# Patient Record
Sex: Male | Born: 1975 | Race: White | Hispanic: No | Marital: Single | Smoking: Current every day smoker
Health system: Southern US, Community
[De-identification: ages and names within clinical notes are randomized; demographics above are authoritative.]

## PROBLEM LIST (undated history)

## (undated) DIAGNOSIS — F909 Attention-deficit hyperactivity disorder, unspecified type: Secondary | ICD-10-CM

## (undated) DIAGNOSIS — J449 Chronic obstructive pulmonary disease, unspecified: Secondary | ICD-10-CM

---

## 2019-11-16 ENCOUNTER — Ambulatory Visit (HOSPITAL_COMMUNITY)
Admission: EM | Admit: 2019-11-16 | Discharge: 2019-11-16 | Disposition: A | Discharge status: Home / Self Care | Payer: Self-pay

## 2019-11-19 ENCOUNTER — Emergency Department (HOSPITAL_COMMUNITY)
Admission: EM | Admit: 2019-11-19 | Discharge: 2019-11-20 | Disposition: A | Discharge status: Home / Self Care | Payer: Self-pay | Attending: Emergency Medicine | Admitting: Emergency Medicine

## 2019-11-19 ENCOUNTER — Encounter (HOSPITAL_COMMUNITY): Payer: Self-pay | Admitting: Emergency Medicine

## 2019-11-19 ENCOUNTER — Other Ambulatory Visit: Payer: Self-pay

## 2019-11-19 DIAGNOSIS — F10930 Alcohol use, unspecified with withdrawal, uncomplicated: Secondary | ICD-10-CM

## 2019-11-19 DIAGNOSIS — R109 Unspecified abdominal pain: Secondary | ICD-10-CM | POA: Insufficient documentation

## 2019-11-19 DIAGNOSIS — F17210 Nicotine dependence, cigarettes, uncomplicated: Secondary | ICD-10-CM | POA: Insufficient documentation

## 2019-11-19 DIAGNOSIS — R112 Nausea with vomiting, unspecified: Secondary | ICD-10-CM | POA: Insufficient documentation

## 2019-11-19 DIAGNOSIS — R000 Tachycardia, unspecified: Secondary | ICD-10-CM | POA: Insufficient documentation

## 2019-11-19 DIAGNOSIS — F10230 Alcohol dependence with withdrawal, uncomplicated: Secondary | ICD-10-CM | POA: Insufficient documentation

## 2019-11-19 LAB — COMPREHENSIVE METABOLIC PANEL
ALT: 21 U/L (ref 0–44)
AST: 27 U/L (ref 15–41)
Albumin: 4.3 g/dL (ref 3.5–5.0)
Alkaline Phosphatase: 77 U/L (ref 38–126)
Anion gap: 15 (ref 5–15)
BUN: 13 mg/dL (ref 6–20)
CO2: 22 mmol/L (ref 22–32)
Calcium: 9.2 mg/dL (ref 8.9–10.3)
Chloride: 100 mmol/L (ref 98–111)
Creatinine, Ser: 0.85 mg/dL (ref 0.61–1.24)
GFR, Estimated: >60 mL/min (ref >60)
Glucose, Bld: 103 mg/dL — ABNORMAL HIGH (ref 70–99)
Potassium: 3.5 mmol/L (ref 3.5–5.1)
Sodium: 137 mmol/L (ref 135–145)
Total Bilirubin: 1.3 mg/dL — ABNORMAL HIGH (ref 0.3–1.2)
Total Protein: 6.8 g/dL (ref 6.5–8.1)

## 2019-11-19 LAB — RAPID URINE DRUG SCREEN, HOSP PERFORMED
Amphetamines: NOT DETECTED
Barbiturates: NOT DETECTED
Benzodiazepines: NOT DETECTED
Cocaine: NOT DETECTED
Opiates: NOT DETECTED
Tetrahydrocannabinol: POSITIVE — AB

## 2019-11-19 LAB — CBC
HCT: 48.8 % (ref 39.0–52.0)
Hemoglobin: 16.4 g/dL (ref 13.0–17.0)
MCH: 32.2 pg (ref 26.0–34.0)
MCHC: 33.6 g/dL (ref 30.0–36.0)
MCV: 95.7 fL (ref 80.0–100.0)
Platelets: 319 10*3/uL (ref 150–400)
RBC: 5.1 MIL/uL (ref 4.22–5.81)
RDW: 12.8 % (ref 11.5–15.5)
WBC: 10 10*3/uL (ref 4.0–10.5)
nRBC: 0 % (ref 0.0–0.2)

## 2019-11-19 LAB — ETHANOL: Alcohol, Ethyl (B): 166 mg/dL — ABNORMAL HIGH (ref <10)

## 2019-11-19 NOTE — ED Triage Notes (Signed)
Pt reports he has been "drinking for a week" and needs to detox. States he "sips" liquor throughout the day, last drink at 6pm today. No SI/HI.

## 2019-11-20 MED ORDER — CHLORDIAZEPOXIDE HCL 25 MG PO CAPS: ORAL_CAPSULE | ORAL | 0 refills | Status: AC

## 2019-11-20 MED ORDER — LORAZEPAM 1 MG PO TABS: 0.0000 mg | ORAL_TABLET | Freq: Two times a day (BID) | ORAL | Status: DC

## 2019-11-20 MED ORDER — LORAZEPAM 2 MG/ML IJ SOLN: 0.0000 mg | Freq: Four times a day (QID) | INTRAMUSCULAR | Status: DC

## 2019-11-20 MED ORDER — THIAMINE HCL 100 MG PO TABS
100.0000 mg | ORAL_TABLET | Freq: Every day | ORAL | Status: DC
  Administered 2019-11-20: 100 mg via ORAL
  Filled 2019-11-20: qty 1

## 2019-11-20 MED ORDER — ONDANSETRON HCL 4 MG/2ML IJ SOLN
4.0000 mg | Freq: Once | INTRAMUSCULAR | Status: AC
  Administered 2019-11-20: 4 mg via INTRAVENOUS
  Filled 2019-11-20: qty 2

## 2019-11-20 MED ORDER — SODIUM CHLORIDE 0.9 % IV BOLUS
1000.0000 mL | Freq: Once | INTRAVENOUS | Status: AC
  Administered 2019-11-20: 1000 mL via INTRAVENOUS

## 2019-11-20 MED ORDER — LORAZEPAM 2 MG/ML IJ SOLN
1.0000 mg | Freq: Once | INTRAMUSCULAR | Status: AC
  Administered 2019-11-20: 1 mg via INTRAVENOUS
  Filled 2019-11-20: qty 1

## 2019-11-20 MED ORDER — THIAMINE HCL 100 MG/ML IJ SOLN: 100.0000 mg | Freq: Every day | INTRAMUSCULAR | Status: DC

## 2019-11-20 MED ORDER — LORAZEPAM 1 MG PO TABS
0.0000 mg | ORAL_TABLET | Freq: Four times a day (QID) | ORAL | Status: DC
  Administered 2019-11-20 (×2): 1 mg via ORAL
  Filled 2019-11-20: qty 2
  Filled 2019-11-20: qty 1

## 2019-11-20 MED ORDER — LORAZEPAM 2 MG/ML IJ SOLN: 0.0000 mg | Freq: Two times a day (BID) | INTRAMUSCULAR | Status: DC

## 2019-11-20 NOTE — Discharge Instructions (Signed)
Please read and follow all provided instructions.  Your diagnoses today include:  1. Alcohol withdrawal syndrome without complication (HCC)     Tests performed today include: Blood cell counts (white, red, and platelets) Electrolytes  Kidney function test Urine test to check for infection Alcohol level - was high  Vital signs. See below for your results today.   Medications prescribed:   Librium - medication to help prevent serious withdrawal symptoms  Take any prescribed medications only as directed.  Home care instructions:  Follow any educational materials contained in this packet.  BE VERY CAREFUL not to take multiple medicines containing Tylenol (also called acetaminophen). Doing so can lead to an overdose which can damage your liver and cause liver failure and possibly death.   Follow-up instructions: Please follow-up with your primary care provider in the next 3 days for further evaluation of your symptoms.   Return instructions:   Please return to the Emergency Department if you experience worsening symptoms.   Please return if you have any other emergent concerns.  Additional Information:  Your vital signs today were: BP 132/85    Pulse 77    Temp 98.6 F (37 C) (Oral)    Resp 15    SpO2 97%  If your blood pressure (BP) was elevated above 135/85 this visit, please have this repeated by your doctor within one month. --------------

## 2019-11-20 NOTE — ED Notes (Addendum)
Pt given ativan before discharge as requested by MD. Pt is given a cab voucher, resting comfortably and understood discharge instructions. Vital signs stable.

## 2019-11-20 NOTE — ED Notes (Signed)
Additional Ativan given as requested by MD.

## 2019-11-20 NOTE — ED Provider Notes (Signed)
[facility] EMERGENCY DEPARTMENT Provider Note   CSN: [id number] Arrival date & time: 11/19/19  1845     History Chief Complaint  Patient presents with   Alcohol Problem    Kirk Hinton is a 44 y.o. male.  Patient presents to the emergency department for evaluation of alcohol withdrawal symptoms.  Patient has a history of alcohol abuse.  He states that he started drinking vodka heavily about a week ago.  Last alcohol intake was 6 PM yesterday.  Prior to that he was "sipping on liquor".  Patient reports worsening tremors, nausea and vomiting, diarrhea.  Patient has had a 13-hour stay in the waiting room prior to being evaluated during which time his symptoms have worsened.  No headache or confusion.  He has generalized abdominal cramping but no focal pain.  No urinary symptoms.  He states that he vomited 4 times in the waiting room.  He has been in rehab in the past, most recently in June of this year.  He denies previous seizures or hallucinations with withdrawal from alcohol.        History reviewed. No pertinent past medical history.  There are no problems to display for this patient.   History reviewed. No pertinent surgical history.     No family history on file.  Social History   Tobacco Use   Smoking status: Current Every Day Smoker  Substance Use Topics   Alcohol use: Yes   Drug use: Not Currently    Home Medications Prior to Admission medications   Not on File    Allergies    Patient has no known allergies.  Review of Systems   Review of Systems  Constitutional: Positive for chills. Negative for fever.  HENT: Negative for rhinorrhea and sore throat.   Eyes: Negative for redness.  Respiratory: Negative for cough and shortness of breath.   Cardiovascular: Negative for chest pain.  Gastrointestinal: Positive for abdominal pain, diarrhea, nausea and vomiting.  Genitourinary: Negative for dysuria and hematuria.    Musculoskeletal: Positive for myalgias.  Skin: Negative for rash.  Neurological: Negative for headaches.    Physical Exam Updated Vital Signs BP (!) 167/94    Pulse (!) 116    Temp 99 F (37.2 C) (Oral)    Resp 18    SpO2 97%   Physical Exam Vitals and nursing note reviewed.  Constitutional:      Appearance: He is well-developed.  HENT:     Head: Normocephalic and atraumatic.     Nose: Nose normal.     Mouth/Throat:     Mouth: Mucous membranes are moist.  Eyes:     General:        Right eye: No discharge.        Left eye: No discharge.     Conjunctiva/sclera: Conjunctivae normal.  Cardiovascular:     Rate and Rhythm: Regular rhythm. Tachycardia present.     Heart sounds: Normal heart sounds.  Pulmonary:     Effort: Pulmonary effort is normal.     Breath sounds: Normal breath sounds.  Abdominal:     Palpations: Abdomen is soft.     Tenderness: There is no abdominal tenderness. There is no guarding or rebound.  Musculoskeletal:     Cervical back: Normal range of motion and neck supple.  Skin:    General: Skin is warm and dry.  Neurological:     Mental Status: He is alert.     Comments: Mild tremors.  I watched  the patient ambulate to the bathroom without any difficulty.     ED Results / Procedures / Treatments   Labs (all labs ordered are listed, but only abnormal results are displayed) Labs Reviewed  COMPREHENSIVE METABOLIC PANEL - Abnormal; Notable for the following components:      Result Value   Glucose, Bld 103 (*)    Total Bilirubin 1.3 (*)    All other components within normal limits  ETHANOL - Abnormal; Notable for the following components:   Alcohol, Ethyl (B) 166 (*)    All other components within normal limits  RAPID URINE DRUG SCREEN, HOSP PERFORMED - Abnormal; Notable for the following components:   Tetrahydrocannabinol POSITIVE (*)    All other components within normal limits  CBC    EKG None  Radiology No results  found.  Procedures Procedures (including critical care time)  Medications Ordered in ED Medications  LORazepam (ATIVAN) injection 0-4 mg (has no administration in time range)    Or  LORazepam (ATIVAN) tablet 0-4 mg (has no administration in time range)  LORazepam (ATIVAN) injection 0-4 mg (has no administration in time range)    Or  LORazepam (ATIVAN) tablet 0-4 mg (has no administration in time range)  thiamine tablet 100 mg (has no administration in time range)    Or  thiamine (B-1) injection 100 mg (has no administration in time range)  ondansetron (ZOFRAN) injection 4 mg (has no administration in time range)  sodium chloride 0.9 % bolus 1,000 mL (has no administration in time range)    ED Course  I have reviewed the triage vital signs and the nursing notes.  Pertinent labs & imaging results that were available during my care of the patient were reviewed by me and considered in my medical decision making (see chart for details).  Patient seen and examined. Work-up initiated. Medications ordered.  He has significant withdrawals.  I would like to give Ativan, Zofran, fluid bolus and reassess.  Will ask RN to calculate CIWA score.  Consult to PEER support placed.  Vital signs reviewed and are as follows: BP (!) 167/94    Pulse (!) 116    Temp 99 F (37.2 C) (Oral)    Resp 18    SpO2 97%   9:00 AM CIWA=10. 1mg  ativan ordered. Will reassess.   10:13 AM patient reassessed.  He states that he is feeling a bit better.  He is still somewhat tremulous.  I will give another milligram of Ativan and reassess.  Heart rate is improved.  BP (!) 150/84 (BP Location: Right Arm)    Pulse 95    Temp 98.6 F (37 C) (Oral)    Resp 16    SpO2 97%   1:15 PM patient rechecked.  CIWA down to 4.  He is feeling much better.  Heart rate remains controlled.  Tremors are nearly resolved.  Plan for discharge to home on Librium taper plus outpatient substance abuse counseling referrals.  Patient is in  agreement with plan.  He is anxious to go home.  Encouraged return to the emergency department with uncontrolled symptoms, hallucinations, seizures, or other concerns.    MDM Rules/Calculators/A&P                          Patient presents to the emergency department today after alcohol cessation.  He was treated with Ativan with much improvement in his symptoms.  Feel patient is stable for discharged home at this point.  He has not had any seizures.  No history of DTs or any evidence of the same today.  Plan as above.  Labs unremarkable.   Final Clinical Impression(s) / ED Diagnoses Final diagnoses:  Alcohol withdrawal syndrome without complication [facility])    Rx / DC Orders ED Discharge Orders         Ordered    chlordiazePOXIDE (LIBRIUM) 25 MG capsule        11/20/19 1312           Renne Crigler, PA-C 11/20/19 1317    Little, Ambrose Finland, MD 11/20/19 1547

## 2019-11-20 NOTE — ED Notes (Signed)
Pt escorted by this RN to the cab. Pt ambulated with steady gait.

## 2020-09-28 ENCOUNTER — Emergency Department (HOSPITAL_COMMUNITY)
Admission: EM | Admit: 2020-09-28 | Discharge: 2020-09-28 | Disposition: A | Discharge status: Home / Self Care | Payer: Self-pay | Attending: Emergency Medicine | Admitting: Emergency Medicine

## 2020-09-28 ENCOUNTER — Emergency Department (HOSPITAL_COMMUNITY): Payer: Self-pay

## 2020-09-28 ENCOUNTER — Encounter (HOSPITAL_COMMUNITY): Payer: Self-pay | Admitting: Emergency Medicine

## 2020-09-28 DIAGNOSIS — U071 COVID-19: Secondary | ICD-10-CM | POA: Insufficient documentation

## 2020-09-28 DIAGNOSIS — J449 Chronic obstructive pulmonary disease, unspecified: Secondary | ICD-10-CM | POA: Insufficient documentation

## 2020-09-28 DIAGNOSIS — F17200 Nicotine dependence, unspecified, uncomplicated: Secondary | ICD-10-CM | POA: Insufficient documentation

## 2020-09-28 LAB — CBC WITH DIFFERENTIAL/PLATELET
Abs Immature Granulocytes: 0.03 10*3/uL (ref 0.00–0.07)
Basophils Absolute: 0.1 10*3/uL (ref 0.0–0.1)
Basophils Relative: 1 %
Eosinophils Absolute: 0 10*3/uL (ref 0.0–0.5)
Eosinophils Relative: 0 %
HCT: 53.3 % — ABNORMAL HIGH (ref 39.0–52.0)
Hemoglobin: 18.2 g/dL — ABNORMAL HIGH (ref 13.0–17.0)
Immature Granulocytes: 0 %
Lymphocytes Relative: 15 %
Lymphs Abs: 1.8 10*3/uL (ref 0.7–4.0)
MCH: 33 pg (ref 26.0–34.0)
MCHC: 34.1 g/dL (ref 30.0–36.0)
MCV: 96.7 fL (ref 80.0–100.0)
Monocytes Absolute: 1.7 10*3/uL — ABNORMAL HIGH (ref 0.1–1.0)
Monocytes Relative: 14 %
Neutro Abs: 8.8 10*3/uL — ABNORMAL HIGH (ref 1.7–7.7)
Neutrophils Relative %: 70 %
Platelets: 267 10*3/uL (ref 150–400)
RBC: 5.51 MIL/uL (ref 4.22–5.81)
RDW: 13.5 % (ref 11.5–15.5)
WBC: 12.4 10*3/uL — ABNORMAL HIGH (ref 4.0–10.5)
nRBC: 0 % (ref 0.0–0.2)

## 2020-09-28 LAB — BASIC METABOLIC PANEL
Anion gap: 11 (ref 5–15)
BUN: 14 mg/dL (ref 6–20)
CO2: 22 mmol/L (ref 22–32)
Calcium: 9.1 mg/dL (ref 8.9–10.3)
Chloride: 104 mmol/L (ref 98–111)
Creatinine, Ser: 0.78 mg/dL (ref 0.61–1.24)
GFR, Estimated: >60 mL/min (ref >60)
Glucose, Bld: 86 mg/dL (ref 70–99)
Potassium: 3.7 mmol/L (ref 3.5–5.1)
Sodium: 137 mmol/L (ref 135–145)

## 2020-09-28 NOTE — ED Provider Notes (Signed)
[facility] EMERGENCY DEPARTMENT Provider Note   CSN: [id number] Arrival date & time: 09/28/20  1459     History No chief complaint on file.   Kirk Hinton is a 45 y.o. male with PMHx COPD who presents for evaluation of cough.   Patient reports an approximately 1 day history of fever, chills, myalgias and nonproductive cough.  He states that his girlfriend recently tested positive for COVID-19, and he tested positive on antigen test yesterday.  He states that he has not experienced any chest pain or shortness of breath.  He states that his girlfriend was insistent that he come to be evaluated for his symptoms and determine whether he would be appropriate for Paxil of it.  He subsequently presented to our emergency department for further evaluation.     History reviewed. No pertinent past medical history.  There are no problems to display for this patient.   History reviewed. No pertinent surgical history.     History reviewed. No pertinent family history.  Social History   Tobacco Use   Smoking status: Every Day   Smokeless tobacco: Never  Substance Use Topics   Alcohol use: Yes   Drug use: Not Currently    Home Medications Prior to Admission medications   Medication Sig Start Date End Date Taking? Authorizing Provider  chlordiazePOXIDE (LIBRIUM) 25 MG capsule 50mg  PO TID x 1D, then 25-50mg  PO BID X 1D, then 25-50mg  PO QD X 1D 11/20/19   13/9/21, PA-C  escitalopram (LEXAPRO) 20 MG tablet Take 20 mg by mouth daily.    [provider]  multivitamin (ONE-A-DAY MEN'S) TABS tablet Take 1 tablet by mouth daily with breakfast.     [provider]    Allergies    Lidocaine  Review of Systems   Review of Systems  Constitutional:  Positive for chills, fatigue and fever.  HENT:  Negative for ear pain and sore throat.   Eyes:  Negative for pain and visual disturbance.  Respiratory:  Positive for cough. Negative for shortness  of breath.   Cardiovascular:  Negative for chest pain and palpitations.  Gastrointestinal:  Negative for abdominal pain and vomiting.  Genitourinary:  Negative for dysuria and hematuria.  Musculoskeletal:  Positive for myalgias. Negative for arthralgias and back pain.  Skin:  Negative for color change and rash.  Neurological:  Negative for seizures and syncope.  All other systems reviewed and are negative.  Physical Exam Updated Vital Signs BP (!) 133/93 (BP Location: Left Arm)   Pulse 73   Temp 98.1 F (36.7 C) (Oral)   Resp 18   SpO2 98%   Physical Exam Vitals and nursing note reviewed.  Constitutional:      Appearance: He is well-developed.  HENT:     Head: Normocephalic and atraumatic.     Mouth/Throat:     Mouth: Mucous membranes are moist.  Eyes:     Conjunctiva/sclera: Conjunctivae normal.  Cardiovascular:     Rate and Rhythm: Normal rate and regular rhythm.     Heart sounds: No murmur heard. Pulmonary:     Effort: Pulmonary effort is normal. No respiratory distress.     Breath sounds: Normal breath sounds.  Abdominal:     Palpations: Abdomen is soft.     Tenderness: There is no abdominal tenderness.  Musculoskeletal:        General: Normal range of motion.     Cervical back: Neck supple.  Skin:    General: Skin is warm  and dry.     Capillary Refill: Capillary refill takes less than 2 seconds.  Neurological:     General: No focal deficit present.     Mental Status: He is alert and oriented to person, place, and time. Mental status is at baseline.    ED Results / Procedures / Treatments   Labs (all labs ordered are listed, but only abnormal results are displayed) Labs Reviewed  CBC WITH DIFFERENTIAL/PLATELET - Abnormal; Notable for the following components:      Result Value   WBC 12.4 (*)    Hemoglobin 18.2 (*)    HCT 53.3 (*)    Neutro Abs 8.8 (*)    Monocytes Absolute 1.7 (*)    All other components within normal limits  BASIC METABOLIC PANEL     EKG None  Radiology DG Chest 1 View  Result Date: 09/28/2020 CLINICAL DATA:  Cough.  COVID-19 exposure. EXAM: CHEST  1 VIEW COMPARISON:  None. FINDINGS: The heart, hila, and mediastinum are normal. No pneumothorax. Hyperinflation of the lungs. No nodule, mass, or focal infiltrate. IMPRESSION: Hyperinflation of the lungs may be due to an exuberant inspiratory effort in this relatively young patient. Air trapping from asthma, COPD, or emphysema could have a similar appearance. Recommend clinical correlation. Electronically Signed   By: Gerome Sam III M.D.   On: 09/28/2020 16:34    Procedures Procedures   Medications Ordered in ED Medications - No data to display  ED Course  I have reviewed the triage vital signs and the nursing notes.  Pertinent labs & imaging results that were available during my care of the patient were reviewed by me and considered in my medical decision making (see chart for details).    MDM Rules/Calculators/A&P                           45 y.o. male with past medical history as above who presents for evaluation of fever and cough in the context of recent COVID-19 exposures and positive antigen test. Afebrile and hemodynamically stable.  Nonhypoxic on room air.  Exam as detailed above.  While patient does report a history of COPD, he is not on any medications to manage this.  He states that he was told that his diagnosis was very mild.  CBC notable for leukocytosis with left shift.  BMP is normal.  Chest x-ray with hyperinflation but no other acute cardiopulmonary normality such as pneumonia or pneumothorax.  After discussion of ambulatory treatment of COVID-19 such as Paxil of it, the patient states that he would not like to be prescribed these medications and incur additional cost.  I feel that this is appropriate.  Advised patient to continue with symptomatic treatment at home including frequent hydration, Tylenol, and ibuprofen.  Patient stable for discharge  home.  Final Clinical Impression(s) / ED Diagnoses Final diagnoses:  COVID-19 virus infection    Rx / DC Orders ED Discharge Orders     None        Holley Dexter, MD 09/28/20 1831    Terrilee Files, MD 09/29/20 1027

## 2020-09-28 NOTE — ED Provider Notes (Signed)
Emergency Medicine Provider Triage Evaluation Note  Amel Gianino , a 45 y.o. male  was evaluated in triage.  Pt complains of cough and fever.  Tested positive for COVID yesterday.  Girlfriend insisted he come in to get Paxlovid.  Review of Systems  Positive: Shortness of breath, sore throat Negative: Chest pain  Physical Exam  BP (!) 133/93 (BP Location: Left Arm)   Pulse 73   Temp 98.1 F (36.7 C) (Oral)   Resp 18   SpO2 98%  Gen:   Awake, no distress   Resp:  Normal effort, clear to auscultation bilaterally MSK:   Moves extremities without difficulty  Other:    Medical Decision Making  Medically screening exam initiated at 3:32 PM.  Appropriate orders placed.  Dmani Mizer was informed that the remainder of the evaluation will be completed by another provider, this initial triage assessment does not replace that evaluation, and the importance of remaining in the ED until their evaluation is complete.     Teressa Lower, PA-C 09/28/20 1533    Franne Forts, DO 09/30/20 [phone]

## 2020-09-28 NOTE — ED Triage Notes (Signed)
Tested COVID + yesterday.  C/o cough and fever.  Requesting Paxlovid.

## 2020-10-17 ENCOUNTER — Other Ambulatory Visit: Payer: Self-pay

## 2020-10-17 ENCOUNTER — Encounter (HOSPITAL_COMMUNITY): Payer: Self-pay

## 2020-10-17 ENCOUNTER — Emergency Department (HOSPITAL_COMMUNITY)
Admission: EM | Admit: 2020-10-17 | Discharge: 2020-10-17 | Disposition: A | Discharge status: Home / Self Care | Payer: Self-pay | Attending: Student | Admitting: Student

## 2020-10-17 DIAGNOSIS — F17210 Nicotine dependence, cigarettes, uncomplicated: Secondary | ICD-10-CM | POA: Insufficient documentation

## 2020-10-17 DIAGNOSIS — S61210A Laceration without foreign body of right index finger without damage to nail, initial encounter: Secondary | ICD-10-CM

## 2020-10-17 DIAGNOSIS — S61211A Laceration without foreign body of left index finger without damage to nail, initial encounter: Secondary | ICD-10-CM | POA: Insufficient documentation

## 2020-10-17 DIAGNOSIS — W268XXA Contact with other sharp object(s), not elsewhere classified, initial encounter: Secondary | ICD-10-CM | POA: Insufficient documentation

## 2020-10-17 DIAGNOSIS — Z23 Encounter for immunization: Secondary | ICD-10-CM | POA: Insufficient documentation

## 2020-10-17 MED ORDER — TETANUS-DIPHTH-ACELL PERTUSSIS 5-2.5-18.5 LF-MCG/0.5 IM SUSY
0.5000 mL | PREFILLED_SYRINGE | Freq: Once | INTRAMUSCULAR | Status: AC
  Administered 2020-10-17: 0.5 mL via INTRAMUSCULAR
  Filled 2020-10-17: qty 0.5

## 2020-10-17 MED ORDER — LIDOCAINE HCL (PF) 1 % IJ SOLN
5.0000 mL | Freq: Once | INTRAMUSCULAR | Status: AC
  Administered 2020-10-17: 5 mL via INTRADERMAL
  Filled 2020-10-17: qty 30

## 2020-10-17 MED ORDER — IBUPROFEN 200 MG PO TABS
600.0000 mg | ORAL_TABLET | Freq: Once | ORAL | Status: AC
  Administered 2020-10-17: 600 mg via ORAL
  Filled 2020-10-17: qty 3

## 2020-10-17 MED ORDER — CHLOROPROCAINE HCL (PF) 2 % IJ SOLN
20.0000 mL | Freq: Once | INTRAMUSCULAR | Status: DC
  Filled 2020-10-17 (×2): qty 20

## 2020-10-17 NOTE — ED Provider Notes (Signed)
Emergency Medicine Provider Triage Evaluation Note  Kirk Hinton , a 45 y.o. male  was evaluated in triage.  Pt complains of laceration.  Dates that he was at work about an hour ago when he was using a box cutter that was about an inch to an inch and half long.  States that he was cutting through a box when he accidentally cut his left pointer finger.  Unknown last tetanus.  Denies other injuries.  Neurovascularly intact.  Injury does not involve the nail.  Review of Systems  Positive: Laceration Negative: Deformity  Physical Exam  BP 136/78 (BP Location: Left Arm)   Pulse (!) 122   Temp 98 F (36.7 C) (Oral)   Resp 18   SpO2 100%  Gen:   Awake, no distress   Resp:  Normal effort  MSK:   Moves extremities without difficulty.  3 cm laceration to the left first digit.  Hemostasis being achieved with pressure.  Pulses equal bilaterally.  Neurovascularly intact. Other:   Medical Decision Making  Medically screening exam initiated at 3:04 PM.  Appropriate orders placed.  Kirk Hinton was informed that the remainder of the evaluation will be completed by another provider, this initial triage assessment does not replace that evaluation, and the importance of remaining in the ED until their evaluation is complete.     Cristopher Peru, PA-C 10/17/20 1506    Mancel Bale, MD 10/19/20 [phone]

## 2020-10-17 NOTE — ED Provider Notes (Signed)
[facility]-EMERGENCY DEPT Provider Note   CSN: [id number] Arrival date & time: 10/17/20  1437     History Chief Complaint  Patient presents with   Laceration    Kirk Hinton is a 45 y.o. male who presents emergency department for evaluation of finger laceration.  Patient states that he was at work this morning and cut his finger with a box cutter.  He denies numbness, tingling, weakness of the extremity.  Pulses are 2+.  Unsure of last tetanus dose.   Laceration Associated symptoms: no fever and no rash       History reviewed. No pertinent past medical history.  There are no problems to display for this patient.   History reviewed. No pertinent surgical history.     History reviewed. No pertinent family history.  Social History   Tobacco Use   Smoking status: Every Day   Smokeless tobacco: Never  Substance Use Topics   Alcohol use: Yes   Drug use: Not Currently    Home Medications Prior to Admission medications   Medication Sig Start Date End Date Taking? Authorizing Provider  chlordiazePOXIDE (LIBRIUM) 25 MG capsule 50mg  PO TID x 1D, then 25-50mg  PO BID X 1D, then 25-50mg  PO QD X 1D 11/20/19   13/9/21, PA-C  escitalopram (LEXAPRO) 20 MG tablet Take 20 mg by mouth daily.    [provider]  multivitamin (ONE-A-DAY MEN'S) TABS tablet Take 1 tablet by mouth daily with breakfast.     [provider]    Allergies    Lidocaine  Review of Systems   Review of Systems  Constitutional:  Negative for chills and fever.  HENT:  Negative for ear pain and sore throat.   Eyes:  Negative for pain and visual disturbance.  Respiratory:  Negative for cough and shortness of breath.   Cardiovascular:  Negative for chest pain and palpitations.  Gastrointestinal:  Negative for abdominal pain and vomiting.  Genitourinary:  Negative for dysuria and hematuria.  Musculoskeletal:  Negative for arthralgias and back pain.  Skin:   Positive for wound. Negative for color change and rash.  Neurological:  Negative for seizures and syncope.  All other systems reviewed and are negative.  Physical Exam Updated Vital Signs BP 136/78 (BP Location: Left Arm)   Pulse (!) 122   Temp 98 F (36.7 C) (Oral)   Resp 18   SpO2 100%   Physical Exam Vitals and nursing note reviewed.  Constitutional:      Appearance: He is well-developed.  HENT:     Head: Normocephalic and atraumatic.  Eyes:     Conjunctiva/sclera: Conjunctivae normal.  Cardiovascular:     Rate and Rhythm: Normal rate and regular rhythm.     Heart sounds: No murmur heard. Pulmonary:     Effort: Pulmonary effort is normal. No respiratory distress.     Breath sounds: Normal breath sounds.  Abdominal:     Palpations: Abdomen is soft.     Tenderness: There is no abdominal tenderness.  Musculoskeletal:     Cervical back: Neck supple.  Skin:    General: Skin is warm and dry.     Findings: Lesion (2.5 cm laceration on the distal finger pad of the left index finger) present.  Neurological:     Mental Status: He is alert.    ED Results / Procedures / Treatments   Labs (all labs ordered are listed, but only abnormal results are displayed) Labs Reviewed - No data to display  EKG None  Radiology No results found.  Procedures .Marland KitchenLaceration Repair  Date/Time: 10/17/2020 10:13 PM Performed by: Glendora Score, MD Authorized by: Glendora Score, MD   Anesthesia:    Anesthesia method:  Nerve block   Block location:  Digital block   Block needle gauge:  24 G   Block anesthetic:  Lidocaine 1% w/o epi   Block injection procedure:  Introduced needle   Block outcome:  Anesthesia achieved Laceration details:    Location:  Finger   Finger location:  L index finger   Length (cm):  2.5 Pre-procedure details:    Preparation:  Patient was prepped and draped in usual sterile fashion Exploration:    Imaging outcome: foreign body not noted     Contaminated:  no   Treatment:    Area cleansed with:  Saline   Amount of cleaning:  Standard   Irrigation solution:  Sterile saline   Irrigation method:  Pressure wash Skin repair:    Repair method:  Sutures   Suture size:  4-0   Suture material:  Prolene   Suture technique:  Simple interrupted   Number of sutures:  7 Approximation:    Approximation:  Close Repair type:    Repair type:  Simple Post-procedure details:    Dressing:  Sterile dressing   Procedure completion:  Tolerated well, no immediate complications   Medications Ordered in ED Medications  Tdap (BOOSTRIX) injection 0.5 mL (has no administration in time range)    ED Course  I have reviewed the triage vital signs and the nursing notes.  Pertinent labs & imaging results that were available during my care of the patient were reviewed by me and considered in my medical decision making (see chart for details).    MDM Rules/Calculators/A&P                           Patient seen emergency department for evaluation of finger laceration.  Physical exam reveals a 2.5 cm laceration to the index finger on the left.  Digital block performed with lidocaine.  Patient previously had a lidocaine allergy listed in the chart but this was actually in relation to a sunscreen and this allergy has since been removed.  The patient had no allergic reaction when lidocaine applied here in the emergency department.  laceration repaired 7 Prolene sutures in simple interrupted fashion.  Tetanus updated and patient discharged. Final Clinical Impression(s) / ED Diagnoses Final diagnoses:  None    Rx / DC Orders ED Discharge Orders     None        Lauren Aguayo, Wyn Forster, MD 10/17/20 2218

## 2020-10-17 NOTE — ED Triage Notes (Signed)
Pt arrived via POV, c/o lac to left ring finger after cutting with box cutter. Also states COVID positive last week, ears blocked.

## 2020-10-27 ENCOUNTER — Other Ambulatory Visit: Payer: Self-pay

## 2020-10-27 ENCOUNTER — Encounter (HOSPITAL_COMMUNITY): Payer: Self-pay

## 2020-10-27 ENCOUNTER — Emergency Department (HOSPITAL_COMMUNITY)
Admission: EM | Admit: 2020-10-27 | Discharge: 2020-10-27 | Disposition: A | Discharge status: Home / Self Care | Payer: Self-pay | Attending: Emergency Medicine | Admitting: Emergency Medicine

## 2020-10-27 DIAGNOSIS — J449 Chronic obstructive pulmonary disease, unspecified: Secondary | ICD-10-CM | POA: Insufficient documentation

## 2020-10-27 DIAGNOSIS — Z4803 Encounter for change or removal of drains: Secondary | ICD-10-CM | POA: Insufficient documentation

## 2020-10-27 DIAGNOSIS — F17210 Nicotine dependence, cigarettes, uncomplicated: Secondary | ICD-10-CM | POA: Insufficient documentation

## 2020-10-27 DIAGNOSIS — Z79899 Other long term (current) drug therapy: Secondary | ICD-10-CM | POA: Insufficient documentation

## 2020-10-27 DIAGNOSIS — Z4802 Encounter for removal of sutures: Secondary | ICD-10-CM

## 2020-10-27 HISTORY — DX: Chronic obstructive pulmonary disease, unspecified: J44.9

## 2020-10-27 HISTORY — DX: Attention-deficit hyperactivity disorder, unspecified type: F90.9

## 2020-10-27 MED ORDER — BACITRACIN ZINC 500 UNIT/GM EX OINT
TOPICAL_OINTMENT | CUTANEOUS | Status: AC
  Administered 2020-10-27: 1 via TOPICAL
  Filled 2020-10-27: qty 0.9

## 2020-10-27 MED ORDER — BACITRACIN ZINC 500 UNIT/GM EX OINT: TOPICAL_OINTMENT | Freq: Once | CUTANEOUS | Status: AC

## 2020-10-27 NOTE — ED Provider Notes (Signed)
[facility]-EMERGENCY DEPT Provider Note   CSN: [id number] Arrival date & time: 10/27/20  1039     History Chief Complaint  Patient presents with   Suture / Staple Removal    Kirk Hinton is a 45 y.o. male.  The history is provided by the patient.  Suture / Staple Removal This is a new problem. The current episode started more than 2 days ago. The problem occurs constantly. Nothing aggravates the symptoms. Nothing relieves the symptoms. He has tried nothing for the symptoms. The treatment provided no relief.      Past Medical History:  Diagnosis Date   ADHD    COPD (chronic obstructive pulmonary disease) (HCC)     There are no problems to display for this patient.   History reviewed. No pertinent surgical history.     History reviewed. No pertinent family history.  Social History   Tobacco Use   Smoking status: Every Day    Packs/day: 1.00    Types: Cigarettes   Smokeless tobacco: Never  Vaping Use   Vaping Use: Never used  Substance Use Topics   Alcohol use: Yes   Drug use: Not Currently    Home Medications Prior to Admission medications   Medication Sig Start Date End Date Taking? Authorizing Provider  chlordiazePOXIDE (LIBRIUM) 25 MG capsule 50mg  PO TID x 1D, then 25-50mg  PO BID X 1D, then 25-50mg  PO QD X 1D 11/20/19   13/9/21, PA-C  escitalopram (LEXAPRO) 20 MG tablet Take 20 mg by mouth daily.    [provider]  multivitamin (ONE-A-DAY MEN'S) TABS tablet Take 1 tablet by mouth daily with breakfast.     [provider]    Allergies    Patient has no active allergies.  Review of Systems   Review of Systems  Constitutional:  Negative for fever.  Skin:  Positive for wound. Negative for color change.   Physical Exam Updated Vital Signs BP 128/77   Pulse 94   Temp 98.3 F (36.8 C) (Oral)   Ht 5\' 10"  (1.778 m)   Wt 79.4 kg   SpO2 98%   BMI 25.11 kg/m   Physical Exam Constitutional:       General: He is not in acute distress.    Appearance: He is not ill-appearing.  Musculoskeletal:        General: No swelling or tenderness. Normal range of motion.  Skin:    General: Skin is warm.     Comments: Left index suture site clean dry and intact, no fluctuance or redness  Neurological:     General: No focal deficit present.     Mental Status: He is alert.     Sensory: No sensory deficit.     Motor: No weakness.    ED Results / Procedures / Treatments   Labs (all labs ordered are listed, but only abnormal results are displayed) Labs Reviewed - No data to display  EKG None  Radiology No results found.  Procedures .Suture Removal  Date/Time: 10/27/2020 11:02 AM Performed by: , DO Authorized by: 10/29/2020, DO   Consent:    Consent obtained:  Verbal   Consent given by:  Patient   Risks, benefits, and alternatives were discussed: yes     Risks discussed:  Bleeding, pain and wound separation   Alternatives discussed:  No treatment Universal protocol:    Procedure explained and questions answered to patient or proxy's satisfaction: yes     Patient identity confirmed:  Verbally with patient Location:    Location: left index finger. Procedure details:    Number of sutures removed:  7 Post-procedure details:    Post-removal:  Antibiotic ointment applied and Band-Aid applied   Procedure completion:  Tolerated   Medications Ordered in ED Medications - No data to display  ED Course  I have reviewed the triage vital signs and the nursing notes.  Pertinent labs & imaging results that were available during my care of the patient were reviewed by me and considered in my medical decision making (see chart for details).    MDM Rules/Calculators/A&P                           Kirk Hinton is here with suture removal to left index finger.  Overall clean dry and intact.  No concern for infection.  Sutures removed.  Recommend bacitracin ointment  twice daily and basic wound care instructions given.  Discharged in good condition.  This chart was dictated using voice recognition software.  Despite best efforts to proofread,  errors can occur which can change the documentation meaning.   Final Clinical Impression(s) / ED Diagnoses Final diagnoses:  Visit for suture removal    Rx / DC Orders ED Discharge Orders     None        Virgina Norfolk, DO 10/27/20 1103

## 2020-10-27 NOTE — ED Triage Notes (Signed)
Patient here for left pointer suture removal.

## 2021-01-21 ENCOUNTER — Emergency Department (HOSPITAL_COMMUNITY)
Admission: EM | Admit: 2021-01-21 | Discharge: 2021-01-21 | Disposition: A | Discharge status: Home / Self Care | Payer: Self-pay | Attending: Emergency Medicine | Admitting: Emergency Medicine

## 2021-01-21 ENCOUNTER — Emergency Department (HOSPITAL_COMMUNITY): Payer: Self-pay

## 2021-01-21 ENCOUNTER — Encounter (HOSPITAL_COMMUNITY): Payer: Self-pay

## 2021-01-21 DIAGNOSIS — R42 Dizziness and giddiness: Secondary | ICD-10-CM | POA: Insufficient documentation

## 2021-01-21 DIAGNOSIS — R251 Tremor, unspecified: Secondary | ICD-10-CM | POA: Insufficient documentation

## 2021-01-21 DIAGNOSIS — R55 Syncope and collapse: Secondary | ICD-10-CM | POA: Insufficient documentation

## 2021-01-21 DIAGNOSIS — R000 Tachycardia, unspecified: Secondary | ICD-10-CM | POA: Insufficient documentation

## 2021-01-21 DIAGNOSIS — J449 Chronic obstructive pulmonary disease, unspecified: Secondary | ICD-10-CM | POA: Insufficient documentation

## 2021-01-21 DIAGNOSIS — R112 Nausea with vomiting, unspecified: Secondary | ICD-10-CM | POA: Insufficient documentation

## 2021-01-21 LAB — CBC WITH DIFFERENTIAL/PLATELET
Abs Immature Granulocytes: 0.06 10*3/uL (ref 0.00–0.07)
Basophils Absolute: 0.1 10*3/uL (ref 0.0–0.1)
Basophils Relative: 1 %
Eosinophils Absolute: 0 10*3/uL (ref 0.0–0.5)
Eosinophils Relative: 0 %
HCT: 54.1 % — ABNORMAL HIGH (ref 39.0–52.0)
Hemoglobin: 18.3 g/dL — ABNORMAL HIGH (ref 13.0–17.0)
Immature Granulocytes: 0 %
Lymphocytes Relative: 17 %
Lymphs Abs: 2.4 10*3/uL (ref 0.7–4.0)
MCH: 29.4 pg (ref 26.0–34.0)
MCHC: 33.8 g/dL (ref 30.0–36.0)
MCV: 87 fL (ref 80.0–100.0)
Monocytes Absolute: 1.3 10*3/uL — ABNORMAL HIGH (ref 0.1–1.0)
Monocytes Relative: 9 %
Neutro Abs: 10.4 10*3/uL — ABNORMAL HIGH (ref 1.7–7.7)
Neutrophils Relative %: 73 %
Platelets: 309 10*3/uL (ref 150–400)
RBC: 6.22 MIL/uL — ABNORMAL HIGH (ref 4.22–5.81)
RDW: 16.7 % — ABNORMAL HIGH (ref 11.5–15.5)
WBC: 14.3 10*3/uL — ABNORMAL HIGH (ref 4.0–10.5)
nRBC: 0 % (ref 0.0–0.2)

## 2021-01-21 LAB — CBG MONITORING, ED: Glucose-Capillary: 99 mg/dL (ref 70–99)

## 2021-01-21 LAB — BASIC METABOLIC PANEL
Anion gap: 11 (ref 5–15)
BUN: <5 mg/dL — ABNORMAL LOW (ref 6–20)
CO2: 22 mmol/L (ref 22–32)
Calcium: 9 mg/dL (ref 8.9–10.3)
Chloride: 104 mmol/L (ref 98–111)
Creatinine, Ser: 0.76 mg/dL (ref 0.61–1.24)
GFR, Estimated: >60 mL/min (ref >60)
Glucose, Bld: 100 mg/dL — ABNORMAL HIGH (ref 70–99)
Potassium: 3.1 mmol/L — ABNORMAL LOW (ref 3.5–5.1)
Sodium: 137 mmol/L (ref 135–145)

## 2021-01-21 LAB — TROPONIN I (HIGH SENSITIVITY): Troponin I (High Sensitivity): 7 ng/L (ref <18)

## 2021-01-21 LAB — MAGNESIUM: Magnesium: 1.6 mg/dL — ABNORMAL LOW (ref 1.7–2.4)

## 2021-01-21 MED ORDER — POTASSIUM CHLORIDE CRYS ER 20 MEQ PO TBCR
40.0000 meq | EXTENDED_RELEASE_TABLET | Freq: Once | ORAL | Status: AC
  Administered 2021-01-21: 40 meq via ORAL
  Filled 2021-01-21: qty 2

## 2021-01-21 MED ORDER — LORAZEPAM 2 MG/ML IJ SOLN
2.0000 mg | Freq: Once | INTRAMUSCULAR | Status: AC
  Administered 2021-01-21: 2 mg via INTRAVENOUS
  Filled 2021-01-21: qty 1

## 2021-01-21 MED ORDER — SODIUM CHLORIDE 0.9 % IV BOLUS
1000.0000 mL | Freq: Once | INTRAVENOUS | Status: AC
  Administered 2021-01-21: 1000 mL via INTRAVENOUS

## 2021-01-21 MED ORDER — MAGNESIUM OXIDE -MG SUPPLEMENT 400 (240 MG) MG PO TABS
400.0000 mg | ORAL_TABLET | Freq: Once | ORAL | Status: AC
  Administered 2021-01-21: 400 mg via ORAL
  Filled 2021-01-21: qty 1

## 2021-01-21 MED ORDER — IOHEXOL 350 MG/ML SOLN
75.0000 mL | Freq: Once | INTRAVENOUS | Status: AC | PRN
  Administered 2021-01-21: 75 mL via INTRAVENOUS

## 2021-01-21 NOTE — Discharge Instructions (Addendum)
You have been seen in the ED after having a syncopal episode at home. Your labs showed you having a low potassium and magnesium. I have replaced these for you already.   Your labs also indicated that you are very dehydrated. You need to drink plenty of fluids at home. I think that this may be contributing to your tremor as well as increased alcohol use. I have also referred you to a neurologist if your tremor does not subside within a couple of days. Please return to the ED if you develop worsening symptoms.

## 2021-01-21 NOTE — ED Triage Notes (Signed)
Pt arrived via EMS, from home, c/o stress, and tremors with anxiety. Recent tremors.

## 2021-01-21 NOTE — ED Notes (Signed)
Dc instructions reviewed with pt. Pt to follow up with neuro. Pt declined wheelchair and ambulated to lobby to wait on significant other to transport home.

## 2021-01-21 NOTE — ED Provider Notes (Addendum)
[place] DEPT Provider Note   CSN: [id number] Arrival date & time: 01/21/21  1428     History  Chief Complaint  Patient presents with   Tremors   Stress    Kirk Hinton is a 46 y.o. male. PMH includes COPD.  Pt complains of a syncopal episode occurring one time last night. He states that he was standing and he felt acutely dizzy with nausea and vomiting. He then passed out. He did not have any neurological symptoms following this event. Of note, he states that his mother passed away about 2 days and he recently started drinking again around this time. 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 He last drank this morning. He complains of an intentional tremor that started this morning in bilateral upper extremities that is worse with movement. He is insistent that this has nothing to do with alcohol use as he has been through withdrawal before.  He also endorses some difficulty producing certain words that has occurred intermittently throughout the day.    HPI     Home Medications Prior to Admission medications   Medication Sig Start Date End Date Taking? Authorizing Provider  chlordiazePOXIDE (LIBRIUM) 25 MG capsule 50mg  PO TID x 1D, then 25-50mg  PO BID X 1D, then 25-50mg  PO QD X 1D 11/20/19   Carlisle Cater, PA-C  escitalopram (LEXAPRO) 20 MG tablet Take 20 mg by mouth daily.    [provider]  multivitamin (ONE-A-DAY MEN'S) TABS tablet Take 1 tablet by mouth daily with breakfast.     [provider]      Allergies    Patient has no known allergies.    Review of Systems   Review of Systems  Gastrointestinal:  Positive for nausea and vomiting.  Neurological:  Positive for dizziness, tremors and syncope.  All other systems reviewed and are negative.  Physical Exam Updated Vital Signs BP (!) 126/99    Pulse 92    Temp 97.8 F (36.6 C) (Oral)    Resp 18    Ht 5\' 10"  (1.778 m)    Wt 76.2 kg    SpO2 95%    BMI  24.11 kg/m  Physical Exam Vitals and nursing note reviewed.  Constitutional:      General: He is not in acute distress.    Appearance: Normal appearance. He is well-developed. He is not ill-appearing, toxic-appearing or diaphoretic.  HENT:     Head: Normocephalic and atraumatic.     Nose: No nasal deformity.     Mouth/Throat:     Lips: Pink. No lesions.  Eyes:     General: Gaze aligned appropriately. No scleral icterus.       Right eye: No discharge.        Left eye: No discharge.     Conjunctiva/sclera: Conjunctivae normal.     Right eye: Right conjunctiva is not injected. No exudate or hemorrhage.    Left eye: Left conjunctiva is not injected. No exudate or hemorrhage. Cardiovascular:     Rate and Rhythm: Regular rhythm. Tachycardia present.     Pulses: No decreased pulses.          Radial pulses are 2+ on the right side and 2+ on the left side.       Dorsalis pedis pulses are 2+ on the right side and 2+ on the left side.     Heart sounds: Normal heart sounds. No murmur heard.   No friction rub. No gallop. No  S3 or S4 sounds.  Pulmonary:     Effort: Pulmonary effort is normal. No respiratory distress.     Breath sounds: Normal breath sounds. No stridor. No decreased breath sounds, wheezing, rhonchi or rales.  Musculoskeletal:     Right lower leg: No edema.     Left lower leg: No edema.  Skin:    General: Skin is warm and dry.  Neurological:     Mental Status: He is alert and oriented to person, place, and time.     GCS: GCS eye subscore is 4. GCS verbal subscore is 5. GCS motor subscore is 6.     Motor: Tremor present.     Comments: There is a tremor of bilateral hands that is worse with.  Is not present at rest. PERRLA. EOM intact. No nystagmus.  Speech Clear A&Ox3 No facial assymetry 5/5 motor strength in all four extremities.  Sensation Grossly intact in all four extremities.  Gait without abnormality.     Psychiatric:        Mood and Affect: Mood normal.         Speech: Speech normal.        Behavior: Behavior normal. Behavior is cooperative.    ED Results / Procedures / Treatments   Labs (all labs ordered are listed, but only abnormal results are displayed) Labs Reviewed  BASIC METABOLIC PANEL - Abnormal; Notable for the following components:      Result Value   Potassium 3.1 (*)    Glucose, Bld 100 (*)    BUN <5 (*)    All other components within normal limits  CBC WITH DIFFERENTIAL/PLATELET - Abnormal; Notable for the following components:   WBC 14.3 (*)    RBC 6.22 (*)    Hemoglobin 18.3 (*)    HCT 54.1 (*)    RDW 16.7 (*)    Neutro Abs 10.4 (*)    Monocytes Absolute 1.3 (*)    All other components within normal limits  MAGNESIUM - Abnormal; Notable for the following components:   Magnesium 1.6 (*)    All other components within normal limits  CBG MONITORING, ED  TROPONIN I (HIGH SENSITIVITY)  TROPONIN I (HIGH SENSITIVITY)    EKG EKG Interpretation  Date/Time:  Wednesday January 21 2021 15:20:57 EST Ventricular Rate:  108 PR Interval:    QRS Duration: 109 QT Interval:  330 QTC Calculation: 443 R Axis:   114 Text Interpretation: Sinus tachycardia Right axis deviation No significant change since last tracing Confirmed by Gareth Morgan [phone]) on 01/21/2021 6:16:05 PM  Radiology CT Angio Head W or Wo Contrast  Result Date: 01/21/2021 CLINICAL DATA:  Initial evaluation for neuro deficit, stroke suspected. EXAM: CT ANGIOGRAPHY HEAD AND NECK TECHNIQUE: Multidetector CT imaging of the head and neck was performed using the standard protocol during bolus administration of intravenous contrast. Multiplanar CT image reconstructions and MIPs were obtained to evaluate the vascular anatomy. Carotid stenosis measurements (when applicable) are obtained utilizing NASCET criteria, using the distal internal carotid diameter as the denominator. RADIATION DOSE REDUCTION: This exam was performed according to the departmental dose-optimization  program which includes automated exposure control, adjustment of the mA and/or kV according to patient size and/or use of iterative reconstruction technique. CONTRAST:  60mL OMNIPAQUE IOHEXOL 350 MG/ML SOLN COMPARISON:  None. FINDINGS: CT HEAD FINDINGS Brain: Cerebral volume within normal limits for patient age. No evidence for acute intracranial hemorrhage. No findings to suggest acute large vessel territory infarct. No mass lesion, midline shift, or  mass effect. Ventricles are normal in size without evidence for hydrocephalus. No extra-axial fluid collection identified. Vascular: No hyperdense vessel identified. Skull: Scalp soft tissues demonstrate no acute abnormality. Calvarium intact. Sinuses/Orbits: Globes and orbital soft tissues within normal limits. Visualized paranasal sinuses are clear. No mastoid effusion. CTA NECK FINDINGS Aortic arch: Visualized aortic arch normal caliber with normal branch pattern. No stenosis about the origin of the great vessels. Right carotid system: Right common and internal carotid arteries widely patent without stenosis, dissection or occlusion. Left carotid system: Left common and internal carotid arteries widely patent without stenosis, dissection or occlusion. Vertebral arteries: Both vertebral arteries arise from the subclavian arteries. No proximal subclavian artery stenosis. Both vertebral arteries widely patent without stenosis, dissection or occlusion. Skeleton: Unremarkable. Other neck: No other acute soft tissue abnormality within the neck. Upper chest: Visualized upper chest demonstrates no acute finding. Centrilobular emphysema. Review of the MIP images confirms the above findings CTA HEAD FINDINGS Anterior circulation: Both internal carotid arteries widely patent to the termini without stenosis. A1 segments widely patent. Normal anterior communicating artery complex. Both anterior cerebral arteries widely patent to their distal aspects without stenosis. No M1  stenosis or occlusion. Normal MCA bifurcations. Distal MCA branches well perfused and symmetric. Posterior circulation: Both V4 segments patent to the vertebrobasilar junction without stenosis. Both PICA origins patent and normal. Basilar widely patent to its distal aspect without stenosis. Superior cerebellar arteries patent bilaterally. Both PCAs primarily supplied via the basilar and are well perfused to there distal aspects. Venous sinuses: Patent allowing for timing the contrast bolus. Anatomic variants: None significant.  No aneurysm. Review of the MIP images confirms the above findings IMPRESSION: 1. Normal CTA of the head and neck. No large vessel occlusion, hemodynamically significant stenosis, or other acute vascular abnormality. 2. Emphysema (ICD10-J43.9). Electronically Signed   By: Rise Mu M.D.   On: 01/21/2021 21:43   CT Angio Neck W and/or Wo Contrast  Result Date: 01/21/2021 CLINICAL DATA:  Initial evaluation for neuro deficit, stroke suspected. EXAM: CT ANGIOGRAPHY HEAD AND NECK TECHNIQUE: Multidetector CT imaging of the head and neck was performed using the standard protocol during bolus administration of intravenous contrast. Multiplanar CT image reconstructions and MIPs were obtained to evaluate the vascular anatomy. Carotid stenosis measurements (when applicable) are obtained utilizing NASCET criteria, using the distal internal carotid diameter as the denominator. RADIATION DOSE REDUCTION: This exam was performed according to the departmental dose-optimization program which includes automated exposure control, adjustment of the mA and/or kV according to patient size and/or use of iterative reconstruction technique. CONTRAST:  40mL OMNIPAQUE IOHEXOL 350 MG/ML SOLN COMPARISON:  None. FINDINGS: CT HEAD FINDINGS Brain: Cerebral volume within normal limits for patient age. No evidence for acute intracranial hemorrhage. No findings to suggest acute large vessel territory infarct. No  mass lesion, midline shift, or mass effect. Ventricles are normal in size without evidence for hydrocephalus. No extra-axial fluid collection identified. Vascular: No hyperdense vessel identified. Skull: Scalp soft tissues demonstrate no acute abnormality. Calvarium intact. Sinuses/Orbits: Globes and orbital soft tissues within normal limits. Visualized paranasal sinuses are clear. No mastoid effusion. CTA NECK FINDINGS Aortic arch: Visualized aortic arch normal caliber with normal branch pattern. No stenosis about the origin of the great vessels. Right carotid system: Right common and internal carotid arteries widely patent without stenosis, dissection or occlusion. Left carotid system: Left common and internal carotid arteries widely patent without stenosis, dissection or occlusion. Vertebral arteries: Both vertebral arteries arise from the subclavian arteries. No  proximal subclavian artery stenosis. Both vertebral arteries widely patent without stenosis, dissection or occlusion. Skeleton: Unremarkable. Other neck: No other acute soft tissue abnormality within the neck. Upper chest: Visualized upper chest demonstrates no acute finding. Centrilobular emphysema. Review of the MIP images confirms the above findings CTA HEAD FINDINGS Anterior circulation: Both internal carotid arteries widely patent to the termini without stenosis. A1 segments widely patent. Normal anterior communicating artery complex. Both anterior cerebral arteries widely patent to their distal aspects without stenosis. No M1 stenosis or occlusion. Normal MCA bifurcations. Distal MCA branches well perfused and symmetric. Posterior circulation: Both V4 segments patent to the vertebrobasilar junction without stenosis. Both PICA origins patent and normal. Basilar widely patent to its distal aspect without stenosis. Superior cerebellar arteries patent bilaterally. Both PCAs primarily supplied via the basilar and are well perfused to there distal  aspects. Venous sinuses: Patent allowing for timing the contrast bolus. Anatomic variants: None significant.  No aneurysm. Review of the MIP images confirms the above findings IMPRESSION: 1. Normal CTA of the head and neck. No large vessel occlusion, hemodynamically significant stenosis, or other acute vascular abnormality. 2. Emphysema (ICD10-J43.9). Electronically Signed   By: Jeannine Boga M.D.   On: 01/21/2021 21:43   MR BRAIN WO CONTRAST  Result Date: 01/21/2021 CLINICAL DATA:  Initial evaluation for neuro deficit, stroke suspected. EXAM: MRI HEAD WITHOUT CONTRAST TECHNIQUE: Multiplanar, multiecho pulse sequences of the brain and surrounding structures were obtained without intravenous contrast. COMPARISON:  Prior CTA from earlier the same day. FINDINGS: Brain: Cerebral volume within normal limits for patient age. No focal parenchymal signal abnormality identified. No abnormal foci of restricted diffusion to suggest acute or subacute ischemia. Gray-white matter differentiation well maintained. No encephalomalacia to suggest chronic infarction. No foci of susceptibility artifact to suggest acute or chronic intracranial hemorrhage. No mass lesion, midline shift or mass effect. No hydrocephalus. No extra-axial fluid collection. Pituitary gland and suprasellar region are normal. Midline structures intact and normal. Vascular: Major intracranial vascular flow voids maintained. Asymmetric FLAIR signal intensity involving the right transverse sinus noted, likely related to slow/sluggish flow. Sinus appears patent on prior CTA. Skull and upper cervical spine: Craniocervical junction within normal limits. Bone marrow signal intensity normal. No scalp soft tissue abnormality. Sinuses/Orbits: Globes orbital soft tissues within normal limits. Paranasal sinuses and mastoid air cells are clear. Other: None. IMPRESSION: Normal brain MRI. No acute intracranial abnormality. Electronically Signed   By: Jeannine Boga M.D.   On: 01/21/2021 21:50    Procedures Procedures  Cardiac monitoring in place  Medications Ordered in ED Medications  sodium chloride 0.9 % bolus 1,000 mL (0 mLs Intravenous Stopped 01/21/21 1720)  potassium chloride SA (KLOR-CON M) CR tablet 40 mEq (40 mEq Oral Given 01/21/21 1615)  magnesium oxide (MAG-OX) tablet 400 mg (400 mg Oral Given 01/21/21 1806)  LORazepam (ATIVAN) injection 2 mg (2 mg Intravenous Given 01/21/21 1820)  iohexol (OMNIPAQUE) 350 MG/ML injection 75 mL (75 mLs Intravenous Contrast Given 01/21/21 1905)    ED Course/ Medical Decision Making/ A&P                           Medical Decision Making Problems Addressed: Vasovagal syncope: acute illness or injury  Amount and/or Complexity of Data Reviewed Labs: ordered. Decision-making details documented in ED Course. ECG/medicine tests: ordered and independent interpretation performed. Decision-making details documented in ED Course.   This is a 46 y.o. male with a PMH of COPD and  alcohol use disorder who presents to the ED with a syncopal episode that occurred last night.  He is also had developing bilateral tremors that are worse with movement as well as some speech difficulties.  He has had increased alcohol intake and last drink was this morning.  Doubt alcohol withdrawal as there has not been enough time for this to have developed.  No other neurological symptoms such as nystagmus, ocular findings, gait abnormalities, or AMS to suggest Wernicke's. I do not appreciate any word finding or speech deficits on exam. Patient is likely dehydrated leading to syncopal event.  We will proceed with syncope work-up.  I personally reviewed all laboratory work and imaging. Abnormal results outlined below. Labs are with a leukocytosis, however there are other increases of hemoglobin, red blood cell count, and hematocrit that suggest that patient has hemoconcentration and is dry.  Patient has no signs of  infection. Hypokalemia to 3.1. Replaced in ED. Mag 1.6. Replaced in ED. EKG does have evidence of ST to about 109.  Likely due to hypovolemia.  Plan to hydrate patient.  On reassessment, HR has normalized. Tremor is still present. No other neurologic symptoms. Tremor likely 2/2 to increased alcohol usage, however with associated speech changes, will evaluate for posterior circulation stroke with CTA head/neck and MRI brain.  Will give Ativan for tremor  Imaging returned negative for acute stroke or dissection. Tremor is somewhat improved with ativan. Patient wishes to be discharged home at this time.  I feel that he is stable to do so. I have provided patients with Alcohol Cessation resources.   I have seen and evaluated this patient with my attending physician who has made changes to the patient's care accordingly.  Portions of this note were generated with [employer]. Dictation errors may occur despite best attempts at proofreading.   Final Clinical Impression(s) / ED Diagnoses Final diagnoses:  Vasovagal syncope  Tremor    Rx / DC Orders ED Discharge Orders          Ordered    Amb referral to [facility]  Status:  Canceled        01/21/21 1800              Mohamed Portlock, Adora Fridge, PA-C 01/21/21 1807    Sherre Poot, Adora Fridge, PA-C 01/21/21 2203    Gareth Morgan, MD 01/23/21 1124

## 2021-01-21 NOTE — ED Notes (Signed)
Patient transported to MRI 

## 2021-01-21 NOTE — ED Provider Triage Note (Signed)
Emergency Medicine Provider Triage Evaluation Note  Kirk Hinton , a 46 y.o. male  was evaluated in triage.  Pt complains of a syncopal episode occurring one time last night. He states that he was standing and he felt acutely dizzy with nausea and vomiting. He then passed out. He did not have any neurological symptoms following this event. Of note, he states that his mother passed away about 6 weeks and he recently started drinking again. 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 He last drank this morning. He complains of an intentional tremor.   Review of Systems  Positive: Tremor, stress, dizzy, nausea, vomiting, syncope Negative:   Physical Exam  BP (!) 158/88 (BP Location: Left Arm)    Pulse (!) 116    Temp 97.8 F (36.6 C) (Oral)    Resp 20    Ht 5\' 10"  (1.778 m)    Wt 76.2 kg    SpO2 98%    BMI 24.11 kg/m  Gen:   Awake, no distress   Resp:  Normal effort  MSK:   Moves extremities without difficulty  Other:    Medical Decision Making  Medically screening exam initiated at 2:58 PM.  Appropriate orders placed.  Kirk Hinton was informed that the remainder of the evaluation will be completed by another provider, this initial triage assessment does not replace that evaluation, and the importance of remaining in the ED until their evaluation is complete.  Syncope work up   Damian Leavell, PA-C 01/21/21 [phone]

## 2021-01-24 ENCOUNTER — Emergency Department (HOSPITAL_COMMUNITY)
Admission: EM | Admit: 2021-01-24 | Discharge: 2021-01-24 | Disposition: A | Discharge status: Home / Self Care | Payer: Medicaid Other | Attending: Emergency Medicine | Admitting: Emergency Medicine

## 2021-01-24 ENCOUNTER — Other Ambulatory Visit: Payer: Self-pay

## 2021-01-24 ENCOUNTER — Encounter (HOSPITAL_COMMUNITY): Payer: Self-pay | Admitting: Emergency Medicine

## 2021-01-24 DIAGNOSIS — R197 Diarrhea, unspecified: Secondary | ICD-10-CM | POA: Insufficient documentation

## 2021-01-24 DIAGNOSIS — F4321 Adjustment disorder with depressed mood: Secondary | ICD-10-CM

## 2021-01-24 DIAGNOSIS — F4320 Adjustment disorder, unspecified: Secondary | ICD-10-CM | POA: Insufficient documentation

## 2021-01-24 DIAGNOSIS — R112 Nausea with vomiting, unspecified: Secondary | ICD-10-CM

## 2021-01-24 LAB — CBC WITH DIFFERENTIAL/PLATELET
Abs Immature Granulocytes: 0.05 10*3/uL (ref 0.00–0.07)
Basophils Absolute: 0.1 10*3/uL (ref 0.0–0.1)
Basophils Relative: 0 %
Eosinophils Absolute: 0 10*3/uL (ref 0.0–0.5)
Eosinophils Relative: 0 %
HCT: 56.2 % — ABNORMAL HIGH (ref 39.0–52.0)
Hemoglobin: 19.5 g/dL — ABNORMAL HIGH (ref 13.0–17.0)
Immature Granulocytes: 0 %
Lymphocytes Relative: 13 %
Lymphs Abs: 1.8 10*3/uL (ref 0.7–4.0)
MCH: 30.4 pg (ref 26.0–34.0)
MCHC: 34.7 g/dL (ref 30.0–36.0)
MCV: 87.7 fL (ref 80.0–100.0)
Monocytes Absolute: 0.9 10*3/uL (ref 0.1–1.0)
Monocytes Relative: 7 %
Neutro Abs: 11.1 10*3/uL — ABNORMAL HIGH (ref 1.7–7.7)
Neutrophils Relative %: 80 %
Platelets: 319 10*3/uL (ref 150–400)
RBC: 6.41 MIL/uL — ABNORMAL HIGH (ref 4.22–5.81)
RDW: 16.5 % — ABNORMAL HIGH (ref 11.5–15.5)
WBC: 14 10*3/uL — ABNORMAL HIGH (ref 4.0–10.5)
nRBC: 0 % (ref 0.0–0.2)

## 2021-01-24 LAB — MAGNESIUM: Magnesium: 2 mg/dL (ref 1.7–2.4)

## 2021-01-24 LAB — COMPREHENSIVE METABOLIC PANEL
ALT: 17 U/L (ref 0–44)
AST: 17 U/L (ref 15–41)
Albumin: 4.3 g/dL (ref 3.5–5.0)
Alkaline Phosphatase: 105 U/L (ref 38–126)
Anion gap: 12 (ref 5–15)
BUN: 13 mg/dL (ref 6–20)
CO2: 25 mmol/L (ref 22–32)
Calcium: 9.3 mg/dL (ref 8.9–10.3)
Chloride: 98 mmol/L (ref 98–111)
Creatinine, Ser: 0.76 mg/dL (ref 0.61–1.24)
GFR, Estimated: >60 mL/min (ref >60)
Glucose, Bld: 100 mg/dL — ABNORMAL HIGH (ref 70–99)
Potassium: 4 mmol/L (ref 3.5–5.1)
Sodium: 135 mmol/L (ref 135–145)
Total Bilirubin: 1.1 mg/dL (ref 0.3–1.2)
Total Protein: 6.9 g/dL (ref 6.5–8.1)

## 2021-01-24 LAB — LIPASE, BLOOD: Lipase: 33 U/L (ref 11–51)

## 2021-01-24 LAB — ETHANOL: Alcohol, Ethyl (B): <10 mg/dL (ref <10)

## 2021-01-24 MED ORDER — HYDROXYZINE HCL 25 MG PO TABS: ORAL_TABLET | ORAL | 0 refills | Status: DC

## 2021-01-24 MED ORDER — ONDANSETRON 4 MG PO TBDP
4.0000 mg | ORAL_TABLET | ORAL | 0 refills | Status: AC | PRN
Start: 2021-01-24 — End: ?

## 2021-01-24 NOTE — ED Provider Notes (Signed)
[facility]-EMERGENCY DEPT Provider Note   CSN: [id number] Arrival date & time: 01/24/21  1501     History  Chief Complaint  Patient presents with   Diarrhea    Kirk Kirk is a 46 y.o. male.  HPI Patient reports he had diarrhea frequently all day today.  He reports now it has slowed down and nearly stopped.  He reports the day before he had had vomiting.  He reports now he mostly feels like he is hungry.  He reports he was able to eat some chips and drink some water in the waiting room without any vomiting or diarrhea.  Did not have any fever.  Patient reports that he thinks that a lot of the symptoms are coming from extra stress and anxiety because his mother passed away about 4 days ago.  Reports he does have family members for support.  He has a girlfriend and his brother is in town.  He denies any thoughts of hurting or killing himself.  He reports he does get anxious when stressful events occur.  He reports it makes him tremulous.  Patient reports that he was not able to go to work yesterday due to the symptoms and his boss told him he had to come and get a work note to come back to work.    Home Medications Prior to Admission medications   Medication Sig Start Date End Date Taking? Authorizing Provider  hydrOXYzine (ATARAX) 25 MG tablet To 2 tablets every 6 hours as needed for anxiety. 01/24/21  Yes Arby Barrette, MD  ondansetron (ZOFRAN-ODT) 4 MG disintegrating tablet Take 1 tablet (4 mg total) by mouth every 4 (four) hours as needed for nausea or vomiting. 01/24/21  Yes Arby Barrette, MD  chlordiazePOXIDE (LIBRIUM) 25 MG capsule 50mg  PO TID x 1D, then 25-50mg  PO BID X 1D, then 25-50mg  PO QD X 1D 11/20/19   13/9/21, PA-C  escitalopram (LEXAPRO) 20 MG tablet Take 20 mg by mouth daily.    [provider]  multivitamin (ONE-A-DAY MEN'S) TABS tablet Take 1 tablet by mouth daily with breakfast.     [provider]      Allergies     Patient has no known allergies.    Review of Systems   Review of Systems 10 systems reviewed negative except as per HPI Physical Exam Updated Vital Signs BP (!) 149/101    Pulse (!) 109    Temp 98.8 F (37.1 C) (Oral)    Resp 18    SpO2 98%  Physical Exam Constitutional:      Comments: Alert nontoxic.  No respiratory distress.  Mental status clear.  HENT:     Head: Normocephalic and atraumatic.     Mouth/Throat:     Mouth: Mucous membranes are moist.     Pharynx: Oropharynx is clear.  Eyes:     Extraocular Movements: Extraocular movements intact.  Cardiovascular:     Rate and Rhythm: Normal rate and regular rhythm.  Pulmonary:     Effort: Pulmonary effort is normal.     Breath sounds: Normal breath sounds.  Abdominal:     General: There is no distension.     Palpations: Abdomen is soft.     Tenderness: There is no abdominal tenderness. There is no guarding.  Musculoskeletal:        General: Normal range of motion.     Right lower leg: No edema.     Left lower leg: No edema.  Skin:  General: Skin is warm and dry.  Neurological:     Comments: No focal deficits.  Patient has clear mental status.  Speech is clear.  He shows just very mild tremor of the hands when he holds them out.  Movements are coordinated and purposeful.  Psychiatric:     Comments: Patient has fair eye contact.  He is communicating appropriately.  Mood is situationally appropriate.    ED Results / Procedures / Treatments   Labs (all labs ordered are listed, but only abnormal results are displayed) Labs Reviewed  COMPREHENSIVE METABOLIC PANEL - Abnormal; Notable for the following components:      Result Value   Glucose, Bld 100 (*)    All other components within normal limits  CBC WITH DIFFERENTIAL/PLATELET - Abnormal; Notable for the following components:   WBC 14.0 (*)    RBC 6.41 (*)    Hemoglobin 19.5 (*)    HCT 56.2 (*)    RDW 16.5 (*)    Neutro Abs 11.1 (*)    All other components  within normal limits  LIPASE, BLOOD  MAGNESIUM  ETHANOL  URINALYSIS, ROUTINE W REFLEX MICROSCOPIC    EKG None  Radiology No results found.  Procedures Procedures    Medications Ordered in ED Medications - No data to display  ED Course/ Medical Decision Making/ A&P                           Medical Decision Making  EMR reviewed for additional past medical history.  Diagnostic work reviewed.  No significant abnormality to chemistry panel or CBC.  Patient does not have hypotension.  Clinically he does not appear significantly dehydrated.  He is now tolerating oral intake.  Low suspicion for any surgical intra-abdominal process.  Patient advises that he thinks symptoms are likely associated with anxiety due to recent death of his mother.  Patient denies any suicidal thoughts.  He reports he does have supports.  He has a girlfriend and a brother who is in town.  Patient reports a large part of the issue was needing a work excuse even though symptoms are resolving, because he did not go to work yesterday and work would not allow him to come back without a note.  Patient requests something for anxiety.  At this time we will prescribe short course of Atarax.  Discharge instructions include resources for grief management.  Patient does have prior history of alcohol abuse documented in EMR.  At this time alcohol level is negative.  Patient has very mild tremor but is not showing any objective signs of significant alcohol withdrawal.  Sensorium is clear.  Resources given for follow-up.  Final Clinical Impression(s) / ED Diagnoses Final diagnoses:  Nausea vomiting and diarrhea  Grief reaction    Rx / DC Orders ED Discharge Orders          Ordered    hydrOXYzine (ATARAX) 25 MG tablet        01/24/21 2125    ondansetron (ZOFRAN-ODT) 4 MG disintegrating tablet  Every 4 hours PRN        01/24/21 2125              Arby Barrette, MD 01/24/21 2149

## 2021-01-24 NOTE — Discharge Instructions (Signed)
1.  Stay hydrated.  You have been given a prescription for Zofran to take for nausea if needed.  Most cases of diarrhea improve after several days. 2.  You can take Vistaril 1 to 2 tablets every 6 hours if needed for anxiety.  You need to get established with counseling and use resources to help you through a difficult period.  Reviewed the section on managing loss in your discharge instructions. 3.  Return to the emergency department if you have worsening symptoms or any thoughts of hurting or killing yourself.

## 2021-01-24 NOTE — ED Provider Triage Note (Signed)
Emergency Medicine Provider Triage Evaluation Note  Kirk Hinton , a 46 y.o. male  was evaluated in triage.  Pt complains of NVD for the past 3-4 days. Denies any abdominal pain, chest pain, or SOB. The patient reports he tried to eat chicken soup but vomited. He was able to keep down his Four Loko last night and beer this morning. Reports MJ use. Was seen here within the past few days for syncope. The patient denies any syncope or lightheadedness now. Reports tremor. Drink 40oz of beer each day.   Review of Systems  Positive: NVD Negative: Dark or tarry stools, hematochezia, syncope, chest pain, SOB  Physical Exam  BP (!) 150/101 (BP Location: Left Arm)    Pulse (!) 102    Temp 98.8 F (37.1 C) (Oral)    Resp 18    SpO2 98%  Gen:   Awake, no distress   Resp:  Normal effort  MSK:   Moves extremities without difficulty  Other:  No abdominal tenderness  Medical Decision Making  Medically screening exam initiated at 3:25 PM.  Appropriate orders placed.  Kirk Hinton was informed that the remainder of the evaluation will be completed by another provider, this initial triage assessment does not replace that evaluation, and the importance of remaining in the ED until their evaluation is complete.  Basic labs ordered   Kirk Hinton, Kirk Hinton 01/24/21 1528

## 2021-01-24 NOTE — ED Triage Notes (Signed)
Patient reports N/V/D and tremors since passing of mother x2 days ago.

## 2021-02-03 ENCOUNTER — Ambulatory Visit (HOSPITAL_COMMUNITY)
Admission: EM | Admit: 2021-02-03 | Discharge: 2021-02-03 | Discharge status: Against Medical Advice | Payer: No Payment, Other

## 2021-02-03 NOTE — Progress Notes (Signed)
°   02/03/21 1500  [facility] Triage Screening (Walk-ins at [facility] only)  How Did You Hear About [place]? Self  What Is the Reason for Your Visit/Call Today? Pt is a 47 yo male who presented voluntarily and unaccompanied due to worsening depression and periodica SI. Pt denies any plan or true intent and denies any past attempts. Pt denies HI, NSSH, AVH and paranoia. Pt reported daily alcohol and cannabis use recently. Pt stated he is a recovering alcoholic but is only drinking 1 for Loco daily instead of the larger quantities of liquor he once drank. Pt reported stressors of his mother passing away 2 weeks ago, his girlfriend who is a drug user is moving out of their home and out of state and he has been sick and "in and out" of the hospital recently. Pt reported he believes he is about to lose his job due to recent circumstances and his reaction to them.  How Long Has This Been Causing You Problems? 1 wk - 1 month  Have You Recently Had Any Thoughts About Hurting Yourself? Yes  How long ago did you have thoughts about hurting yourself? 2 days ago while working  Are You Planning to [employer] Suicide/Harm Yourself At This time? No  Have you Recently Had Thoughts About Hurting Someone Karolee Ohs? No  Are You Planning To Harm Someone At This Time? No  Are you currently experiencing any auditory, visual or other hallucinations? No  Have You Used Any Alcohol or Drugs in the Past 24 Hours? Yes  How long ago did you use Drugs or Alcohol? cannabis last night and alcohol today  Do you have any current medical co-morbidities that require immediate attention? No  Clinician description of patient physical appearance/behavior: Pt was calm, cooperative, alert and oriented x 4. Pt's affect was flat and his mood seemed depressed which was congruent. Pt's speech and movement were within normal limits. Pt's judgement and insight seemed fair but impaired. .  What Do You Feel Would Help You the Most Today? Treatment for Depression or other  mood problem;Medication(s)  If access to [facility] Urgent Care was not available, would you have sought care in the Emergency Department? Yes  Determination of Need Routine (7 days)  Options For Referral Medication Management;Outpatient Therapy   Brizza Nathanson T. Jimmye Norman, MS, [facility], [facility] Triage Specialist [facility]

## 2021-02-03 NOTE — Progress Notes (Signed)
Per report pt left without being seen at 1730.

## 2021-02-07 ENCOUNTER — Ambulatory Visit (HOSPITAL_COMMUNITY)
Admission: EM | Admit: 2021-02-07 | Discharge: 2021-02-07 | Disposition: A | Discharge status: Home / Self Care | Payer: No Payment, Other | Attending: Family | Admitting: Family

## 2021-02-07 DIAGNOSIS — F4323 Adjustment disorder with mixed anxiety and depressed mood: Secondary | ICD-10-CM

## 2021-02-07 DIAGNOSIS — F1010 Alcohol abuse, uncomplicated: Secondary | ICD-10-CM

## 2021-02-07 MED ORDER — HYDROXYZINE HCL 25 MG PO TABS: 25.0000 mg | ORAL_TABLET | Freq: Three times a day (TID) | ORAL | Status: DC | PRN

## 2021-02-07 MED ORDER — HYDROXYZINE HCL 25 MG PO TABS: 25.0000 mg | ORAL_TABLET | Freq: Three times a day (TID) | ORAL | 0 refills | Status: AC | PRN

## 2021-02-07 NOTE — ED Provider Notes (Signed)
Behavioral Health Urgent Care Medical Screening Exam  Patient Name: Kirk Hinton MRN: [id number] Date of Evaluation: 02/07/21 Chief Complaint:   Diagnosis:  Final diagnoses:  Adjustment disorder with mixed anxiety and depressed mood    History of Present illness: Kirk Hinton is a 46 y.o. male.  Presents to [facility] urgent care requesting something to help with anxiety.  He reports he was recently evaluated 2 days prior for similar symptoms.  Currently he is denying suicidal or homicidal ideations.  Denies auditory or visual hallucinations.    Reports his anxiety is worse due to his girlfriend threatening to put him out of the hotel room.  He reports the passing of his mother 2 weeks ago.  States he was offered outpatient services through [facility], however states "their wait time is 3 weeks and he is unable to wait that long".  He is requesting a prescription for librium as he was provided at previous visit. Patient was offer hydroxyzine and follow up with [facility] walk-I clinic and or  [facility]/ [facility] follow-up. Patient was receptive to plan.   During evaluation [name] is sitting  in no acute distress.  He is alert/oriented x 4; calm/cooperative; and mood congruent with affect.  He is speaking in a clear tone at moderate volume, and normal pace; with good eye contact. His thought process is coherent and relevant; There is no indication that he is currently responding to internal/external stimuli or experiencing delusional thought content; and he has denied suicidal/self-harm/homicidal ideation, psychosis, and paranoia.   Patient has remained calm throughout assessment and has answered questions appropriately.     At this time [name] is educated and verbalizes understanding of mental health resources and other crisis services in the community. He is instructed to call 911 and present to the nearest emergency room should he experience  any suicidal/homicidal ideation, auditory/visual/hallucinations, or detrimental worsening of his mental health condition. He was a also advised by Probation officer that he could call the toll-free phone on insurance card to assist with identifying in network counselors and agencies or number on back of Medicaid card to speak with care coordinator      Psychiatric Specialty Exam  Presentation  General Appearance:Appropriate for Environment  Eye Contact:Good  Speech:Clear and Coherent  Speech Volume:Normal  Handedness:Right   Mood and Affect  Mood:Anxious; Depressed Affect:Congruent  Thought Process  Thought Processes:Coherent Descriptions of Associations:Intact  Orientation:Full (Time, Place and Person)  Thought Content:Logical    Hallucinations:None  Ideas of Reference:None  Suicidal Thoughts:No  Homicidal Thoughts:No   Sensorium  Memory:Immediate Fair; Recent Fair; Remote [place] [employer]ight:Fair  Executive Functions  Concentration:Good Attention Span:Fair [place] Language:Good  Psychomotor Activity  Psychomotor Activity:Normal  Assets  Assets:Communication Skills; Social Support  Sleep  Sleep:Fair Number of hours: No data recorded  Nutritional Assessment (For OBS and FBC admissions only) Has the patient had a weight loss or gain of 10 pounds or more in the last 3 months?: No Has the patient had a decrease in food intake/or appetite?: No Does the patient have dental problems?: No Does the patient have eating habits or behaviors that may be indicators of an eating disorder including binging or inducing vomiting?: No Has the patient recently lost weight without trying?: 0 Has the patient been eating poorly because of a decreased appetite?: 0 Malnutrition Screening Tool Score: 0    Physical Exam: Physical Exam Vitals and nursing note reviewed.  Cardiovascular:     Rate and  Rhythm: Tachycardia present.  Neurological:      Mental Status: He is alert and oriented to person, place, and time.  Psychiatric:        Mood and Affect: Mood normal.   Review of Systems  Constitutional: Negative.   Cardiovascular:  Negative for chest pain.  Gastrointestinal: Negative.   Skin: Negative.   Neurological:  Negative for dizziness and headaches.  Psychiatric/Behavioral:  Positive for depression. Negative for hallucinations and suicidal ideas. The patient is nervous/anxious.   All other systems reviewed and are negative. Blood pressure (!) 147/105, pulse (!) 119, temperature 98.2 F (36.8 C), resp. rate 18, SpO2 97 %. There is no height or weight on file to calculate BMI.  Musculoskeletal: Strength & Muscle Tone: within normal limits Gait & Station: normal Patient leans: N/A   [place] [facility] Discharge Disposition for Follow up and Recommendations: Patient to follow-up for elevated blood pressure and elevated pulse 127. -repeat    Derrill Center, NP 02/07/2021, 2:26 PM

## 2021-02-07 NOTE — Progress Notes (Signed)
°   02/07/21 1417  [facility] Triage Screening (Walk-ins at [facility] only)  What Is the Reason for Your Visit/Call Today? Shizuo is a 46 yo male presenting to [facility] for evaluation of anxiety.  Pt currently denying SI, HI, or AVH.  Pt is having some relationship conflict that is triggering anxiety and pt requesting medication to help manage symptoms.  How Long Has This Been Causing You Problems? <Week  Have You Recently Had Any Thoughts About Hurting Yourself? No  Are You Planning to Commit Suicide/Harm Yourself At This time? No  Have you Recently Had Thoughts About Hurting Someone Karolee Ohs? No  Are You Planning To Harm Someone At This Time? No  Are you currently experiencing any auditory, visual or other hallucinations? No  Have You Used Any Alcohol or Drugs in the Past 24 Hours? No  Do you have any current medical co-morbidities that require immediate attention? No  What Do You Feel Would Help You the Most Today? Treatment for Depression or other mood problem  If access to [facility] Urgent Care was not available, would you have sought care in the Emergency Department? Yes  Determination of Need Routine (7 days)  Options For Referral Medication Management;Outpatient Therapy

## 2021-02-07 NOTE — Discharge Instructions (Addendum)
Take all medications as prescribed. Keep all follow-up appointments as scheduled.  Do not consume alcohol or use illegal drugs while on prescription medications. Report any adverse effects from your medications to your primary care provider promptly.  In the event of recurrent symptoms or worsening symptoms, call 911, a crisis hotline, or go to the nearest emergency department for evaluation.   

## 2021-02-07 NOTE — Discharge Summary (Signed)
[name] to be D/C'd Home per NP order. An After Visit Summary was printed and given to the patient by provider. Patient escorted out, and D/C home via private auto.  [name]  02/07/2021 2:32 PM

## 2021-02-19 ENCOUNTER — Telehealth (HOSPITAL_COMMUNITY): Payer: Self-pay

## 2021-02-19 NOTE — BH Assessment (Signed)
Care Management - [facility] Follow Up Discharges  ° °Writer attempted to make contact with patient today and was unsuccessful.  Writer left a HIPPA compliant voice message.  ° °Per chart review, patient was provided with outpatient resources. ° °

## 2021-04-14 ENCOUNTER — Encounter (HOSPITAL_COMMUNITY): Payer: Self-pay | Admitting: Emergency Medicine

## 2021-04-14 ENCOUNTER — Emergency Department (HOSPITAL_COMMUNITY)
Admission: EM | Admit: 2021-04-14 | Discharge: 2021-04-14 | Disposition: A | Discharge status: Home / Self Care | Payer: Medicaid Other | Attending: Emergency Medicine | Admitting: Emergency Medicine

## 2021-04-14 DIAGNOSIS — R350 Frequency of micturition: Secondary | ICD-10-CM | POA: Insufficient documentation

## 2021-04-14 DIAGNOSIS — R319 Hematuria, unspecified: Secondary | ICD-10-CM | POA: Insufficient documentation

## 2021-04-14 DIAGNOSIS — R300 Dysuria: Secondary | ICD-10-CM | POA: Insufficient documentation

## 2021-04-14 LAB — URINALYSIS, ROUTINE W REFLEX MICROSCOPIC
Bacteria, UA: NONE SEEN
Bilirubin Urine: NEGATIVE
Glucose, UA: NEGATIVE mg/dL
Ketones, ur: NEGATIVE mg/dL
Leukocytes,Ua: NEGATIVE
Nitrite: NEGATIVE
Protein, ur: NEGATIVE mg/dL
Specific Gravity, Urine: 1.016 (ref 1.005–1.030)
pH: 5 (ref 5.0–8.0)

## 2021-04-14 MED ORDER — PHENAZOPYRIDINE HCL 200 MG PO TABS: 200.0000 mg | ORAL_TABLET | Freq: Three times a day (TID) | ORAL | 0 refills | Status: AC

## 2021-04-14 NOTE — ED Provider Triage Note (Signed)
Emergency Medicine Provider Triage Evaluation Note ? ?Kirk Hinton , a 46 y.o. male  was evaluated in triage.  Pt complains of urinary urgency, frequency, and suprapubic abdominal pain.  This been present for 4 days.  No fever or chills.  No hematuria.  No flank pain.  No nausea or vomiting.  Patient also wondering if he get tested for HSV.  Denies penile lesions. ? ?Review of Systems  ?Positive:  ?Negative: See above  ? ?Physical Exam  ?BP 119/79 (BP Location: Right Arm)   Pulse (!) 104   Temp 99.1 ?F (37.3 ?C) (Oral)   Resp 18   Ht 5\' 10"  (1.778 m)   Wt 76.2 kg   SpO2 97%   BMI 24.11 kg/m?  ?Gen:   Awake, no distress   ?Resp:  Normal effort  ?MSK:   Moves extremities without difficulty  ?Other:   ? ?Medical Decision Making  ?Medically screening exam initiated at 2:07 PM.  Appropriate orders placed.  Kirk Hinton was informed that the remainder of the evaluation will be completed by another provider, this initial triage assessment does not replace that evaluation, and the importance of remaining in the ED until their evaluation is complete. ? ? ?  ?Hendricks Limes, PA-C ?04/14/21 1408 ? ?

## 2021-04-14 NOTE — ED Triage Notes (Signed)
Per pt, states he has been having pelvic discomfort along with urinary frequency for the past 4 days-thinks he might have a UTI ?

## 2021-04-14 NOTE — ED Provider Notes (Signed)
?[facility]-EMERGENCY DEPT ?Provider Note ? ? ?CSN: [id number] ?Arrival date & time: 04/14/21  1341 ? ?  ? ?History ? ?Chief Complaint  ?Patient presents with  ? Dysuria  ? ? ?Kirk Hinton is a 46 y.o. male. ? ?46 year old male presents with complaint of urinary frequency x 4-5 days. Denies dysuria, hematuria, discharge, changes in bowel habits, fevers, rectal pain. Concerned he may have a UTI, no history of same.  ? ? ?  ? ?Home Medications ?Prior to Admission medications   ?Medication Sig Start Date End Date Taking? Authorizing Provider  ?phenazopyridine (PYRIDIUM) 200 MG tablet Take 1 tablet (200 mg total) by mouth 3 (three) times daily. 04/14/21  Yes Jeannie Fend, PA-C  ?chlordiazePOXIDE (LIBRIUM) 25 MG capsule 50mg  PO TID x 1D, then 25-50mg  PO BID X 1D, then 25-50mg  PO QD X 1D 11/20/19   13/9/21, PA-C  ?escitalopram (LEXAPRO) 20 MG tablet Take 20 mg by mouth daily.    [provider]  ?hydrOXYzine (ATARAX) 25 MG tablet Take 1 tablet (25 mg total) by mouth 3 (three) times daily as needed for anxiety. 02/07/21   02/09/21, NP  ?multivitamin (ONE-A-DAY MEN'S) TABS tablet Take 1 tablet by mouth daily with breakfast.     [provider]  ?ondansetron (ZOFRAN-ODT) 4 MG disintegrating tablet Take 1 tablet (4 mg total) by mouth every 4 (four) hours as needed for nausea or vomiting. 01/24/21   01/26/21, MD  ?   ? ?Allergies    ?Patient has no known allergies.   ? ?Review of Systems   ?Review of Systems ?Negative except as per HPI ?Physical Exam ?Updated Vital Signs ?BP 110/72 (BP Location: Right Arm)   Pulse 77   Temp 99 ?F (37.2 ?C) (Oral)   Resp 18   Ht 5\' 10"  (1.778 m)   Wt 76.2 kg   SpO2 100%   BMI 24.11 kg/m?  ?Physical Exam ?Vitals and nursing note reviewed.  ?Constitutional:   ?   General: He is not in acute distress. ?   Appearance: He is well-developed. He is not diaphoretic.  ?HENT:  ?   Head: Normocephalic and atraumatic.  ?Cardiovascular:  ?    Rate and Rhythm: Normal rate and regular rhythm.  ?   Heart sounds: Normal heart sounds.  ?Pulmonary:  ?   Effort: Pulmonary effort is normal.  ?   Breath sounds: Normal breath sounds.  ?Abdominal:  ?   Tenderness: There is no abdominal tenderness. There is no right CVA tenderness or left CVA tenderness.  ?Skin: ?   General: Skin is warm and dry.  ?   Findings: No erythema or rash.  ?Neurological:  ?   Mental Status: He is alert and oriented to person, place, and time.  ?Psychiatric:     ?   Behavior: Behavior normal.  ? ? ?ED Results / Procedures / Treatments   ?Labs ?(all labs ordered are listed, but only abnormal results are displayed) ?Labs Reviewed  ?URINALYSIS, ROUTINE W REFLEX MICROSCOPIC - Abnormal; Notable for the following components:  ?    Result Value  ? Hgb urine dipstick SMALL (*)   ? All other components within normal limits  ?GC/CHLAMYDIA PROBE AMP ([place]) NOT AT [facility]  ? ? ?EKG ?None ? ?Radiology ?No results found. ? ?Procedures ?Procedures  ? ? ?Medications Ordered in ED ?Medications - No data to display ? ?ED Course/ Medical Decision Making/ A&P ?  ?                        ?  Medical Decision Making ?Risk ?Prescription drug management. ? ? ?46 yo male with urinary frequency. Reports history of kidney stones but states does not feel same. Denies dysuria, rectal pain, difficulty voiding. UA with small hgb, negative for leuk and nitrates. Will add gc/c testing as send out. Try pyridium for his discomfort. Referred to urology for follow up. Consider possible kidney stone, as pt is well appearing with no cva or abdominal tenderness, do not feel he needs CT at this time, does not appear to have an obstructing stone if this is the case.  ? ? ? ? ? ? ? ?Final Clinical Impression(s) / ED Diagnoses ?Final diagnoses:  ?Dysuria  ?Hematuria, unspecified type  ? ? ?Rx / DC Orders ?ED Discharge Orders   ? ?      Ordered  ?  phenazopyridine (PYRIDIUM) 200 MG tablet  3 times daily       ? 04/14/21 1536  ? ?   ?  ? ?  ? ? ?  ?Jeannie Fend, PA-C ?04/14/21 1601 ? ?  ?Terrilee Files, MD ?04/15/21 [phone] ? ?

## 2021-04-14 NOTE — Discharge Instructions (Addendum)
Additional tests sent out on your urine. Your results will be available in about 5 days in your MyChart account. ?Try pyridium for your discomfort. ?Follow up with [facility], call to schedule an appointment if symptoms persist.  ?

## 2021-04-14 NOTE — ED Notes (Signed)
I provided reinforced discharge education based off of discharge instructions. Pt acknowledged and understood my education. Pt had no further questions/concerns for provider/myself.  °

## 2021-04-15 LAB — GC/CHLAMYDIA PROBE AMP (~~LOC~~) NOT AT ARMC
Chlamydia: NEGATIVE
Comment: NEGATIVE
Comment: NORMAL
Neisseria Gonorrhea: NEGATIVE

## 2021-05-16 ENCOUNTER — Other Ambulatory Visit: Payer: Self-pay

## 2021-05-16 ENCOUNTER — Emergency Department (HOSPITAL_COMMUNITY): Payer: Self-pay

## 2021-05-16 ENCOUNTER — Encounter (HOSPITAL_COMMUNITY): Payer: Self-pay | Admitting: Emergency Medicine

## 2021-05-16 ENCOUNTER — Emergency Department (HOSPITAL_COMMUNITY)
Admission: EM | Admit: 2021-05-16 | Discharge: 2021-05-16 | Disposition: A | Discharge status: Home / Self Care | Payer: Self-pay | Attending: Emergency Medicine | Admitting: Emergency Medicine

## 2021-05-16 DIAGNOSIS — R0789 Other chest pain: Secondary | ICD-10-CM | POA: Insufficient documentation

## 2021-05-16 DIAGNOSIS — R079 Chest pain, unspecified: Secondary | ICD-10-CM

## 2021-05-16 DIAGNOSIS — F419 Anxiety disorder, unspecified: Secondary | ICD-10-CM | POA: Insufficient documentation

## 2021-05-16 LAB — CBC WITH DIFFERENTIAL/PLATELET
Abs Immature Granulocytes: 0.04 10*3/uL (ref 0.00–0.07)
Basophils Absolute: 0.1 10*3/uL (ref 0.0–0.1)
Basophils Relative: 1 %
Eosinophils Absolute: 0.1 10*3/uL (ref 0.0–0.5)
Eosinophils Relative: 1 %
HCT: 45.7 % (ref 39.0–52.0)
Hemoglobin: 16 g/dL (ref 13.0–17.0)
Immature Granulocytes: 0 %
Lymphocytes Relative: 27 %
Lymphs Abs: 2.8 10*3/uL (ref 0.7–4.0)
MCH: 33.8 pg (ref 26.0–34.0)
MCHC: 35 g/dL (ref 30.0–36.0)
MCV: 96.6 fL (ref 80.0–100.0)
Monocytes Absolute: 0.9 10*3/uL (ref 0.1–1.0)
Monocytes Relative: 9 %
Neutro Abs: 6.3 10*3/uL (ref 1.7–7.7)
Neutrophils Relative %: 62 %
Platelets: 277 10*3/uL (ref 150–400)
RBC: 4.73 MIL/uL (ref 4.22–5.81)
RDW: 11.8 % (ref 11.5–15.5)
WBC: 10.2 10*3/uL (ref 4.0–10.5)
nRBC: 0 % (ref 0.0–0.2)

## 2021-05-16 LAB — BASIC METABOLIC PANEL
Anion gap: 11 (ref 5–15)
BUN: 11 mg/dL (ref 6–20)
CO2: 22 mmol/L (ref 22–32)
Calcium: 8.8 mg/dL — ABNORMAL LOW (ref 8.9–10.3)
Chloride: 107 mmol/L (ref 98–111)
Creatinine, Ser: 0.72 mg/dL (ref 0.61–1.24)
GFR, Estimated: >60 mL/min (ref >60)
Glucose, Bld: 95 mg/dL (ref 70–99)
Potassium: 3.6 mmol/L (ref 3.5–5.1)
Sodium: 140 mmol/L (ref 135–145)

## 2021-05-16 LAB — TROPONIN I (HIGH SENSITIVITY)
Troponin I (High Sensitivity): <2 ng/L (ref <18)
Troponin I (High Sensitivity): 2 ng/L (ref <18)

## 2021-05-16 NOTE — Discharge Instructions (Signed)
Your test today were normal. ? ?Follow-up with your doctor or cardiologist in 2 to 3 days. ? ?Return back to the ER if you have worsening symptoms difficulty breathing worsening pain or any additional concerns. ?

## 2021-05-16 NOTE — ED Triage Notes (Signed)
Patient c/o worsening anxiety and life stressors related to work, family, and significant other. Denies SI/HI. ?

## 2021-05-16 NOTE — ED Provider Notes (Signed)
?[facility] DEPT ?Provider Note ? ? ?CSN: [id number] ?Arrival date & time: 05/16/21  1804 ? ?  ? ?History ? ?Chief Complaint  ?Patient presents with  ? Anxiety  ? ? ?Kirk Hinton is a 46 y.o. male. ? ?Presents ER chief complaint of increased stressors in life and work and feeling of anxiety and chest pain.  Symptoms have been ongoing for a week but worse today.  He was headed to work around 5 PM when he felt chest tightness.  He states that his father and his father significant other are giving him a lot of stress denies any palpitations or diaphoresis denies any difficulty breathing.  Denies any cardiac history. ? ? ?  ? ?Home Medications ?Prior to Admission medications   ?Medication Sig Start Date End Date Taking? Authorizing Provider  ?chlordiazePOXIDE (LIBRIUM) 25 MG capsule 50mg  PO TID x 1D, then 25-50mg  PO BID X 1D, then 25-50mg  PO QD X 1D 11/20/19   Carlisle Cater, PA-C  ?escitalopram (LEXAPRO) 20 MG tablet Take 20 mg by mouth daily.    [provider]  ?hydrOXYzine (ATARAX) 25 MG tablet Take 1 tablet (25 mg total) by mouth 3 (three) times daily as needed for anxiety. 02/07/21   Derrill Center, NP  ?multivitamin (ONE-A-DAY MEN'S) TABS tablet Take 1 tablet by mouth daily with breakfast.     [provider]  ?ondansetron (ZOFRAN-ODT) 4 MG disintegrating tablet Take 1 tablet (4 mg total) by mouth every 4 (four) hours as needed for nausea or vomiting. 01/24/21   Charlesetta Shanks, MD  ?phenazopyridine (PYRIDIUM) 200 MG tablet Take 1 tablet (200 mg total) by mouth 3 (three) times daily. 04/14/21   Tacy Learn, PA-C  ?   ? ?Allergies    ?Patient has no known allergies.   ? ?Review of Systems   ?Review of Systems  ?Constitutional:  Negative for fever.  ?HENT:  Negative for ear pain and sore throat.   ?Eyes:  Negative for pain.  ?Respiratory:  Negative for cough.   ?Cardiovascular:  Positive for chest pain.  ?Gastrointestinal:  Negative for abdominal pain.   ?Genitourinary:  Negative for flank pain.  ?Musculoskeletal:  Negative for back pain.  ?Skin:  Negative for color change and rash.  ?Neurological:  Negative for syncope.  ?All other systems reviewed and are negative. ? ?Physical Exam ?Updated Vital Signs ?BP 128/84   Pulse 94   Temp 98 ?F (36.7 ?C) (Oral)   Resp (!) 22   SpO2 97%  ?Physical Exam ?Constitutional:   ?   Appearance: He is well-developed.  ?HENT:  ?   Head: Normocephalic.  ?   Nose: Nose normal.  ?Eyes:  ?   Extraocular Movements: Extraocular movements intact.  ?Cardiovascular:  ?   Rate and Rhythm: Normal rate.  ?Pulmonary:  ?   Effort: Pulmonary effort is normal.  ?Abdominal:  ?   Tenderness: There is no abdominal tenderness. There is no guarding or rebound.  ?Skin: ?   Coloration: Skin is not jaundiced.  ?Neurological:  ?   General: No focal deficit present.  ?   Mental Status: He is alert and oriented to person, place, and time. Mental status is at baseline.  ? ? ?ED Results / Procedures / Treatments   ?Labs ?(all labs ordered are listed, but only abnormal results are displayed) ?Labs Reviewed  ?BASIC METABOLIC PANEL - Abnormal; Notable for the following components:  ?    Result Value  ? Calcium 8.8 (*)   ?  All other components within normal limits  ?CBC WITH DIFFERENTIAL/PLATELET  ?TROPONIN I (HIGH SENSITIVITY)  ?TROPONIN I (HIGH SENSITIVITY)  ? ? ?EKG ?EKG Interpretation ? ?Date/Time:  Saturday May 16 2021 18:21:10 EDT ?Ventricular Rate:  97 ?PR Interval:  205 ?QRS Duration: 105 ?QT Interval:  338 ?QTC Calculation: 430 ?R Axis:   84 ?Text Interpretation: Sinus rhythm Prolonged PR interval Probable left atrial enlargement Anteroseptal infarct, age indeterminate Confirmed by Thamas Jaegers ([id number]) on 05/16/2021 6:25:12 PM ? ?Radiology ?DG Chest Port 1 View ? ?Result Date: 05/16/2021 ?CLINICAL DATA:  Worsening anxiety.  Chest pain EXAM: PORTABLE CHEST 1 VIEW COMPARISON:  Chest radiograph 09/28/2020 FINDINGS: Normal cardiomediastinal contours. The lungs  are clear. No pneumothorax or pleural effusion. No acute finding in the regional skeleton. IMPRESSION: No acute cardiopulmonary process. Electronically Signed   By: Audie Pinto M.D.   On: 05/16/2021 18:54   ? ?Procedures ?Procedures  ? ? ?Medications Ordered in ED ?Medications - No data to display ? ?ED Course/ Medical Decision Making/ A&P ?  ?                        ?Medical Decision Making ?Amount and/or Complexity of Data Reviewed ?Labs: ordered. ?Radiology: ordered. ? ? ?Cardiac monitor shows sinus rhythm. ? ?Chart review shows ER visit April 14, 2021 for pain with urination. ? ?Diagnostic studies includes labs White count 10 16 chemistry remarkable troponin is flat x2.  EKG is unremarkable showing sinus rhythm no ST elevations or depressions noted. ? ?Patient advised outpatient follow-up with cardiology within the week.  Advised immediate return for worsening pain fevers or any additional concerns. ? ? ? ? ? ? ? ?Final Clinical Impression(s) / ED Diagnoses ?Final diagnoses:  ?Chest pain, unspecified type  ? ? ?Rx / DC Orders ?ED Discharge Orders   ? ? None  ? ?  ? ? ?  ?Luna Fuse, MD ?05/16/21 2138 ? ?

## 2021-05-16 NOTE — ED Notes (Signed)
Patient given education on contacting cardiologist. Patient denies SI/HI.  ?

## 2021-05-25 ENCOUNTER — Emergency Department (HOSPITAL_COMMUNITY): Payer: Medicaid Other

## 2021-05-25 ENCOUNTER — Other Ambulatory Visit: Payer: Self-pay

## 2021-05-25 ENCOUNTER — Encounter (HOSPITAL_COMMUNITY): Payer: Self-pay | Admitting: Emergency Medicine

## 2021-05-25 ENCOUNTER — Emergency Department (HOSPITAL_COMMUNITY)
Admission: EM | Admit: 2021-05-25 | Discharge: 2021-05-25 | Disposition: A | Discharge status: Home / Self Care | Payer: Medicaid Other | Attending: Emergency Medicine | Admitting: Emergency Medicine

## 2021-05-25 DIAGNOSIS — N23 Unspecified renal colic: Secondary | ICD-10-CM

## 2021-05-25 LAB — URINALYSIS, ROUTINE W REFLEX MICROSCOPIC
Bacteria, UA: NONE SEEN
Bilirubin Urine: NEGATIVE
Glucose, UA: NEGATIVE mg/dL
Ketones, ur: NEGATIVE mg/dL
Leukocytes,Ua: NEGATIVE
Nitrite: NEGATIVE
Protein, ur: NEGATIVE mg/dL
RBC / HPF: >50 RBC/hpf — ABNORMAL HIGH (ref 0–5)
Specific Gravity, Urine: 1.023 (ref 1.005–1.030)
pH: 5 (ref 5.0–8.0)

## 2021-05-25 LAB — COMPREHENSIVE METABOLIC PANEL
ALT: 9 U/L (ref 0–44)
AST: 12 U/L — ABNORMAL LOW (ref 15–41)
Albumin: 4.2 g/dL (ref 3.5–5.0)
Alkaline Phosphatase: 75 U/L (ref 38–126)
Anion gap: 9 (ref 5–15)
BUN: 11 mg/dL (ref 6–20)
CO2: 25 mmol/L (ref 22–32)
Calcium: 9.3 mg/dL (ref 8.9–10.3)
Chloride: 106 mmol/L (ref 98–111)
Creatinine, Ser: 1.15 mg/dL (ref 0.61–1.24)
GFR, Estimated: >60 mL/min (ref >60)
Glucose, Bld: 100 mg/dL — ABNORMAL HIGH (ref 70–99)
Potassium: 4.1 mmol/L (ref 3.5–5.1)
Sodium: 140 mmol/L (ref 135–145)
Total Bilirubin: 0.8 mg/dL (ref 0.3–1.2)
Total Protein: 6.2 g/dL — ABNORMAL LOW (ref 6.5–8.1)

## 2021-05-25 LAB — CBC WITH DIFFERENTIAL/PLATELET
Abs Immature Granulocytes: 0.03 10*3/uL (ref 0.00–0.07)
Basophils Absolute: 0.1 10*3/uL (ref 0.0–0.1)
Basophils Relative: 1 %
Eosinophils Absolute: 0.1 10*3/uL (ref 0.0–0.5)
Eosinophils Relative: 1 %
HCT: 47.5 % (ref 39.0–52.0)
Hemoglobin: 16.4 g/dL (ref 13.0–17.0)
Immature Granulocytes: 0 %
Lymphocytes Relative: 23 %
Lymphs Abs: 2.2 10*3/uL (ref 0.7–4.0)
MCH: 33.8 pg (ref 26.0–34.0)
MCHC: 34.5 g/dL (ref 30.0–36.0)
MCV: 97.9 fL (ref 80.0–100.0)
Monocytes Absolute: 0.8 10*3/uL (ref 0.1–1.0)
Monocytes Relative: 8 %
Neutro Abs: 6.6 10*3/uL (ref 1.7–7.7)
Neutrophils Relative %: 67 %
Platelets: 261 10*3/uL (ref 150–400)
RBC: 4.85 MIL/uL (ref 4.22–5.81)
RDW: 11.8 % (ref 11.5–15.5)
WBC: 9.9 10*3/uL (ref 4.0–10.5)
nRBC: 0 % (ref 0.0–0.2)

## 2021-05-25 MED ORDER — TAMSULOSIN HCL 0.4 MG PO CAPS: 0.4000 mg | ORAL_CAPSULE | Freq: Every day | ORAL | 0 refills | Status: AC

## 2021-05-25 NOTE — ED Provider Triage Note (Signed)
Emergency Medicine Provider Triage Evaluation Note ? ?Kirk Hinton , a 46 y.o. male  was evaluated in triage.  Pt complains of left lower quadrant pain.  Symptoms began last night and improved with ibuprofen and woke up this morning with improvement.  However symptoms recurred earlier today.  Reports associated nausea but denies any vomiting.  Unsure if this is a pulled muscle.  Denies any urinary symptoms.  No fever.  Distant history of kidney stones. ? ?Review of Systems  ?Positive: Abdominal pain ?Negative: Fever ? ?Physical Exam  ?BP (!) 139/95 (BP Location: Left Arm)   Pulse 72   Temp 97.7 ?F (36.5 ?C) (Oral)   Resp 16   SpO2 98%  ?Gen:   Awake, no distress   ?Resp:  Normal effort  ?MSK:   Moves extremities without difficulty  ?Other:  Abdomen is soft ? ?Medical Decision Making  ?Medically screening exam initiated at 7:42 PM.  Appropriate orders placed.  [name] was informed that the remainder of the evaluation will be completed by another provider, this initial triage assessment does not replace that evaluation, and the importance of remaining in the ED until their evaluation is complete. ? ?Labs and imaging ordered ?  ?[name], PA-C ?05/25/21 1945 ? ?

## 2021-05-25 NOTE — ED Notes (Signed)
Microbiology agreed to add on urine culture to previous UA ?

## 2021-05-25 NOTE — ED Triage Notes (Signed)
Pt reported to ED with c/o pain to lower left flank that radiates between back and lower left quadrant since yesterday. Denies any frequent or malodorous urination. States urine has been darker than normal.  ?

## 2021-05-25 NOTE — Discharge Instructions (Signed)
Return for any problem.  ? ?Ibuprofen as directed for pain -600 mg every 8 hours. ? ?Drink plenty of fluids. ? ? ?

## 2021-05-25 NOTE — ED Provider Notes (Signed)
?[facility] ?Provider Note ? ? ?CSN: [id number] ?Arrival date & time: 05/25/21  1921 ? ?  ? ?History ? ?Chief Complaint  ?Patient presents with  ? Abdominal Pain  ? Flank Pain  ? ? ?Kirk Hinton is a 46 y.o. male. ? ?46 year old male with prior medical history as detailed below presents for evaluation.  Patient complains of left-sided flank pain that began yesterday.  Patient reports some improvement with use of ibuprofen. ? ?Patient reports prior history of renal colic.  He has never required intervention for kidney stones. ?He denies associated fever or nausea. ?He reports on exam that his symptoms have significantly improved during his ED wait time. ? ?The history is provided by the patient and medical records.  ?Flank Pain ?This is a new problem. The current episode started yesterday. The problem occurs rarely. The problem has been resolved. Pertinent negatives include no chest pain and no abdominal pain. Nothing aggravates the symptoms.  ? ?  ? ?Home Medications ?Prior to Admission medications   ?Medication Sig Start Date End Date Taking? Authorizing Provider  ?tamsulosin (FLOMAX) 0.4 MG CAPS capsule Take 1 capsule (0.4 mg total) by mouth daily for 5 days. 05/25/21 05/30/21 Yes MessickWallis Bamberg, MD  ?chlordiazePOXIDE (LIBRIUM) 25 MG capsule 50mg  PO TID x 1D, then 25-50mg  PO BID X 1D, then 25-50mg  PO QD X 1D 11/20/19   Carlisle Cater, PA-C  ?escitalopram (LEXAPRO) 20 MG tablet Take 20 mg by mouth daily.    [provider]  ?hydrOXYzine (ATARAX) 25 MG tablet Take 1 tablet (25 mg total) by mouth 3 (three) times daily as needed for anxiety. 02/07/21   Derrill Center, NP  ?multivitamin (ONE-A-DAY MEN'S) TABS tablet Take 1 tablet by mouth daily with breakfast.     [provider]  ?ondansetron (ZOFRAN-ODT) 4 MG disintegrating tablet Take 1 tablet (4 mg total) by mouth every 4 (four) hours as needed for nausea or vomiting. 01/24/21   Charlesetta Shanks, MD   ?phenazopyridine (PYRIDIUM) 200 MG tablet Take 1 tablet (200 mg total) by mouth 3 (three) times daily. 04/14/21   Tacy Learn, PA-C  ?   ? ?Allergies    ?Patient has no known allergies.   ? ?Review of Systems   ?Review of Systems  ?Cardiovascular:  Negative for chest pain.  ?Gastrointestinal:  Negative for abdominal pain.  ?All other systems reviewed and are negative. ? ?Physical Exam ?Updated Vital Signs ?BP 130/89   Pulse 90   Temp 97.7 ?F (36.5 ?C) (Oral)   Resp 18   SpO2 96%  ?Physical Exam ?Vitals and nursing note reviewed.  ?Constitutional:   ?   General: He is not in acute distress. ?   Appearance: Normal appearance. He is well-developed.  ?HENT:  ?   Head: Normocephalic and atraumatic.  ?Eyes:  ?   Conjunctiva/sclera: Conjunctivae normal.  ?   Pupils: Pupils are equal, round, and reactive to light.  ?Cardiovascular:  ?   Rate and Rhythm: Normal rate and regular rhythm.  ?   Heart sounds: Normal heart sounds.  ?Pulmonary:  ?   Effort: Pulmonary effort is normal. No respiratory distress.  ?   Breath sounds: Normal breath sounds.  ?Abdominal:  ?   General: There is no distension.  ?   Palpations: Abdomen is soft.  ?   Tenderness: There is no abdominal tenderness.  ?Musculoskeletal:     ?   General: No deformity. Normal range of motion.  ?  Cervical back: Normal range of motion and neck supple.  ?Skin: ?   General: Skin is warm and dry.  ?Neurological:  ?   General: No focal deficit present.  ?   Mental Status: He is alert and oriented to person, place, and time.  ? ? ?ED Results / Procedures / Treatments   ?Labs ?(all labs ordered are listed, but only abnormal results are displayed) ?Labs Reviewed  ?COMPREHENSIVE METABOLIC PANEL - Abnormal; Notable for the following components:  ?    Result Value  ? Glucose, Bld 100 (*)   ? Total Protein 6.2 (*)   ? AST 12 (*)   ? All other components within normal limits  ?URINALYSIS, ROUTINE W REFLEX MICROSCOPIC - Abnormal; Notable for the following components:  ?  APPearance HAZY (*)   ? Hgb urine dipstick LARGE (*)   ? RBC / HPF >50 (*)   ? All other components within normal limits  ?URINE CULTURE  ?CBC WITH DIFFERENTIAL/PLATELET  ? ? ?EKG ?None ? ?Radiology ?CT Renal Stone Study ? ?Result Date: 05/25/2021 ?CLINICAL DATA:  Left lower flank pain radiating to the back and left lower quadrant since yesterday. EXAM: CT ABDOMEN AND PELVIS WITHOUT CONTRAST TECHNIQUE: Multidetector CT imaging of the abdomen and pelvis was performed following the standard protocol without IV contrast. RADIATION DOSE REDUCTION: This exam was performed according to the departmental dose-optimization program which includes automated exposure control, adjustment of the mA and/or kV according to patient size and/or use of iterative reconstruction technique. COMPARISON:  None Available. FINDINGS: Lower chest: No significant pulmonary nodules or acute consolidative airspace disease. Hepatobiliary: Normal liver size. No liver mass. Normal gallbladder with no radiopaque cholelithiasis. No biliary ductal dilatation. Pancreas: Normal, with no mass or duct dilation. Spleen: Normal size. No mass. Adrenals/Urinary Tract: Normal adrenals. Nonobstructing 2 mm lower right renal stone. No right hydronephrosis. Obstructing 4 mm distal left pelvic ureteral stone located 1.5 cm above the left ureterovesical junction with mild left hydroureteronephrosis. Normal caliber right ureter. No additional ureteral stones. No contour deforming renal masses. Normal nondistended bladder. Stomach/Bowel: Normal non-distended stomach. Normal caliber small bowel with no small bowel wall thickening. Normal appendix. Normal large bowel with no diverticulosis, large bowel wall thickening or pericolonic fat stranding. Vascular/Lymphatic: Atherosclerotic nonaneurysmal abdominal aorta. No pathologically enlarged lymph nodes in the abdomen or pelvis. Reproductive: Normal size prostate. Other: No pneumoperitoneum, ascites or focal fluid  collection. Musculoskeletal: No aggressive appearing focal osseous lesions. IMPRESSION: 1. Obstructing 4 mm distal left pelvic ureteral stone with mild left hydroureteronephrosis. 2. Nonobstructing 2 mm lower right renal stone. 3. Aortic Atherosclerosis (ICD10-I70.0). Electronically Signed   By: Ilona Sorrel M.D.   On: 05/25/2021 20:48   ? ?Procedures ?Procedures  ? ? ?Medications Ordered in ED ?Medications - No data to display ? ?ED Course/ Medical Decision Making/ A&P ?  ?                        ?Medical Decision Making ?Risk ?Prescription drug management. ? ? ? ?Medical Screen Complete ? ?This patient presented to the ED with complaint of left flank pain. ? ?This complaint involves an extensive number of treatment options. The initial differential diagnosis includes, but is not limited to, renal colic ? ?This presentation is: Acute, Self-Limited, Previously Undiagnosed, Uncertain Prognosis, Complicated, and Systemic Symptoms ?Patient with prior history of renal colic. ? ?Patient presents with onset of left-sided flank pain that began yesterday. ? ?CT imaging reveals evidence of left-sided  4 mm ureteral stone. ? ?Upon eval in the exam room the patient is now pain-free.  His pain significantly improved during his ED wait time. ? ?Other screening labs obtained are without significant abnormality. ? ?Patient desires DC home. ? ?He does understand need for close follow-up with urology. ? ?He was offered and declines narcotics for use at home.  He will use ibuprofen.  He understands need to consume plenty of fluids and to use Flomax as prescribed. ? ?Strict return precautions given and understood.   ? ?Additional history obtained: ? ?External records from outside sources obtained and reviewed including prior ED visits and prior Inpatient records.  ? ? ?Lab Tests: ? ?I ordered and personally interpreted labs.  The pertinent results include: CBC CMP, UA ? ? ?Imaging Studies ordered: ? ?I ordered imaging studies including  CT stone hunt ?I independently visualized and interpreted obtained imaging which showed left-sided 4 mm ureteral stone ?I agree with the radiologist interpretation. ? ? ?Cardiac Monitoring: ? ?The patient was maintained

## 2021-05-27 LAB — URINE CULTURE: Culture: NO GROWTH

## 2021-09-04 ENCOUNTER — Other Ambulatory Visit: Payer: Self-pay

## 2021-09-04 ENCOUNTER — Emergency Department (HOSPITAL_COMMUNITY)
Admission: EM | Admit: 2021-09-04 | Discharge: 2021-09-04 | Disposition: A | Discharge status: Home / Self Care | Payer: Medicaid Other | Attending: Emergency Medicine | Admitting: Emergency Medicine

## 2021-09-04 ENCOUNTER — Encounter (HOSPITAL_COMMUNITY): Payer: Self-pay

## 2021-09-04 ENCOUNTER — Emergency Department (HOSPITAL_COMMUNITY): Payer: Medicaid Other

## 2021-09-04 DIAGNOSIS — N132 Hydronephrosis with renal and ureteral calculous obstruction: Secondary | ICD-10-CM | POA: Insufficient documentation

## 2021-09-04 DIAGNOSIS — N200 Calculus of kidney: Secondary | ICD-10-CM

## 2021-09-04 LAB — CBC
HCT: 48.1 % (ref 39.0–52.0)
Hemoglobin: 16.6 g/dL (ref 13.0–17.0)
MCH: 33.3 pg (ref 26.0–34.0)
MCHC: 34.5 g/dL (ref 30.0–36.0)
MCV: 96.6 fL (ref 80.0–100.0)
Platelets: 274 10*3/uL (ref 150–400)
RBC: 4.98 MIL/uL (ref 4.22–5.81)
RDW: 11.9 % (ref 11.5–15.5)
WBC: 9.2 10*3/uL (ref 4.0–10.5)
nRBC: 0 % (ref 0.0–0.2)

## 2021-09-04 LAB — COMPREHENSIVE METABOLIC PANEL
ALT: 22 U/L (ref 0–44)
AST: 23 U/L (ref 15–41)
Albumin: 4.1 g/dL (ref 3.5–5.0)
Alkaline Phosphatase: 80 U/L (ref 38–126)
Anion gap: 10 (ref 5–15)
BUN: 14 mg/dL (ref 6–20)
CO2: 22 mmol/L (ref 22–32)
Calcium: 9.1 mg/dL (ref 8.9–10.3)
Chloride: 111 mmol/L (ref 98–111)
Creatinine, Ser: 0.79 mg/dL (ref 0.61–1.24)
GFR, Estimated: >60 mL/min (ref >60)
Glucose, Bld: 114 mg/dL — ABNORMAL HIGH (ref 70–99)
Potassium: 4 mmol/L (ref 3.5–5.1)
Sodium: 143 mmol/L (ref 135–145)
Total Bilirubin: 0.8 mg/dL (ref 0.3–1.2)
Total Protein: 6.4 g/dL — ABNORMAL LOW (ref 6.5–8.1)

## 2021-09-04 LAB — URINALYSIS, ROUTINE W REFLEX MICROSCOPIC
Bilirubin Urine: NEGATIVE
Glucose, UA: NEGATIVE mg/dL
Ketones, ur: NEGATIVE mg/dL
Leukocytes,Ua: NEGATIVE
Nitrite: NEGATIVE
Protein, ur: 30 mg/dL — AB
Specific Gravity, Urine: 1.025 (ref 1.005–1.030)
pH: 5 (ref 5.0–8.0)

## 2021-09-04 LAB — LIPASE, BLOOD: Lipase: 41 U/L (ref 11–51)

## 2021-09-04 MED ORDER — OXYCODONE-ACETAMINOPHEN 5-325 MG PO TABS
1.0000 | ORAL_TABLET | Freq: Four times a day (QID) | ORAL | 0 refills | Status: AC | PRN
Start: 2021-09-04 — End: ?

## 2021-09-04 NOTE — ED Provider Triage Note (Signed)
Emergency Medicine Provider Triage Evaluation Note  Kirk Hinton , a 46 y.o. male  was evaluated in triage.  Pt complains of right sided flank pain. States that same began suddenly 1 hour ago and has been persistent since. States that he had 1 episode of associated vomiting without diarrhea. Has been having normal bowel movements. No hx of abdominal surgeries. Hx of kidney stones on the left, states this feels similarly.  Review of Systems  Positive:  Negative:   Physical Exam  BP (!) 137/96 (BP Location: Left Arm)   Pulse 93   Temp 98.6 F (37 C) (Oral)   Resp 18   SpO2 98%  Gen:   Awake, no distress   Resp:  Normal effort  MSK:   Moves extremities without difficulty  Other:  No palpable abdominal tenderness or CVA tenderness  Medical Decision Making  Medically screening exam initiated at 3:32 PM.  Appropriate orders placed.  Kirk Hinton was informed that the remainder of the evaluation will be completed by another provider, this initial triage assessment does not replace that evaluation, and the importance of remaining in the ED until their evaluation is complete.     Silva Bandy, PA-C 09/04/21 1534

## 2021-09-04 NOTE — ED Provider Notes (Addendum)
[facility]-EMERGENCY DEPT Provider Note   CSN: [id number] Arrival date & time: 09/04/21  1445     History  Chief Complaint  Patient presents with   Abdominal Pain   Back Pain    Johsua Shevlin is a 46 y.o. male.  46 year old male presents to cute onset of right-sided flank pain.  States that began suddenly about an hour ago.  History of kidney stones in the past and this feels similar.  Denies any dysuria or hematuria.  No fever or chills.  Hard to find a spot that was comfortable.  Symptoms resolved spontaneously without treatment.  Feels back to his baseline.  Denies any testicular pain or swelling       Home Medications Prior to Admission medications   Medication Sig Start Date End Date Taking? Authorizing Provider  chlordiazePOXIDE (LIBRIUM) 25 MG capsule 50mg  PO TID x 1D, then 25-50mg  PO BID X 1D, then 25-50mg  PO QD X 1D 11/20/19   13/9/21, PA-C  escitalopram (LEXAPRO) 20 MG tablet Take 20 mg by mouth daily.    [provider]  hydrOXYzine (ATARAX) 25 MG tablet Take 1 tablet (25 mg total) by mouth 3 (three) times daily as needed for anxiety. 02/07/21   02/09/21, NP  multivitamin (ONE-A-DAY MEN'S) TABS tablet Take 1 tablet by mouth daily with breakfast.     [provider]  ondansetron (ZOFRAN-ODT) 4 MG disintegrating tablet Take 1 tablet (4 mg total) by mouth every 4 (four) hours as needed for nausea or vomiting. 01/24/21   01/26/21, MD  phenazopyridine (PYRIDIUM) 200 MG tablet Take 1 tablet (200 mg total) by mouth 3 (three) times daily. 04/14/21   06/14/21, PA-C      Allergies    Patient has no known allergies.    Review of Systems   Review of Systems  All other systems reviewed and are negative.   Physical Exam Updated Vital Signs BP (!) 137/96 (BP Location: Left Arm)   Pulse 93   Temp 98.6 F (37 C) (Oral)   Resp 18   SpO2 98%  Physical Exam Vitals and nursing note reviewed.   Constitutional:      General: He is not in acute distress.    Appearance: Normal appearance. He is well-developed. He is not toxic-appearing.  HENT:     Head: Normocephalic and atraumatic.  Eyes:     General: Lids are normal.     Conjunctiva/sclera: Conjunctivae normal.     Pupils: Pupils are equal, round, and reactive to light.  Neck:     Thyroid: No thyroid mass.     Trachea: No tracheal deviation.  Cardiovascular:     Rate and Rhythm: Normal rate and regular rhythm.     Heart sounds: Normal heart sounds. No murmur heard.    No gallop.  Pulmonary:     Effort: Pulmonary effort is normal. No respiratory distress.     Breath sounds: Normal breath sounds. No stridor. No decreased breath sounds, wheezing, rhonchi or rales.  Abdominal:     General: There is no distension.     Palpations: Abdomen is soft.     Tenderness: There is no abdominal tenderness. There is no right CVA tenderness or rebound.  Musculoskeletal:        General: No tenderness. Normal range of motion.     Cervical back: Normal range of motion and neck supple.  Skin:    General: Skin is warm and dry.  Findings: No abrasion or rash.  Neurological:     Mental Status: He is alert and oriented to person, place, and time. Mental status is at baseline.     GCS: GCS eye subscore is 4. GCS verbal subscore is 5. GCS motor subscore is 6.     Cranial Nerves: No cranial nerve deficit.     Sensory: No sensory deficit.     Motor: Motor function is intact.  Psychiatric:        Attention and Perception: Attention normal.        Speech: Speech normal.        Behavior: Behavior normal.     ED Results / Procedures / Treatments   Labs (all labs ordered are listed, but only abnormal results are displayed) Labs Reviewed  COMPREHENSIVE METABOLIC PANEL - Abnormal; Notable for the following components:      Result Value   Glucose, Bld 114 (*)    Total Protein 6.4 (*)    All other components within normal limits  LIPASE,  BLOOD  CBC  URINALYSIS, ROUTINE W REFLEX MICROSCOPIC    EKG None  Radiology CT Renal Stone Study  Result Date: 09/04/2021 CLINICAL DATA:  Flank pain, kidney stone suspected. Right flank pain for 2 days. EXAM: CT ABDOMEN AND PELVIS WITHOUT CONTRAST TECHNIQUE: Multidetector CT imaging of the abdomen and pelvis was performed following the standard protocol without IV contrast. RADIATION DOSE REDUCTION: This exam was performed according to the departmental dose-optimization program which includes automated exposure control, adjustment of the mA and/or kV according to patient size and/or use of iterative reconstruction technique. COMPARISON:  CT examination May 25, 2021 FINDINGS: Lower chest: No acute abnormality. Hepatobiliary: No focal liver abnormality is seen. No gallstones, gallbladder wall thickening, or biliary dilatation. Pancreas: Unremarkable. No pancreatic ductal dilatation or surrounding inflammatory changes. Spleen: Normal in size without focal abnormality. Adrenals/Urinary Tract: Mild right hydroureteronephrosis secondary to a 3 mm calculus in the distal right ureter at the ureterovesical junction. Punctate nonobstructing calculus in the interpolar region of the right kidney. Left kidney and ureter are unremarkable. Adrenals are unremarkable. Stomach/Bowel: Stomach is within normal limits. Appendix appears normal. No evidence of bowel wall thickening, distention, or inflammatory changes. Vascular/Lymphatic: No significant vascular findings are present. No enlarged abdominal or pelvic lymph nodes. Reproductive: Prostate is unremarkable. Other: No abdominal wall hernia or abnormality. No abdominopelvic ascites. Musculoskeletal: No acute or significant osseous findings. IMPRESSION: 1. Moderate right hydroureteronephrosis secondary to a 3 mm calculus in the distal right ureter at ureterovesical junction. 2. Punctate nonobstructing calculus in the interpolar region of the right kidney. Left kidney and  ureter are unremarkable. 3. Normal appendix. Bowel loops are normal in caliber. No evidence of colitis or diverticulitis. Electronically Signed   By: Larose Hires D.O.   On: 09/04/2021 16:32    Procedures Procedures    Medications Ordered in ED Medications - No data to display  ED Course/ Medical Decision Making/ A&P                           Medical Decision Making Amount and/or Complexity of Data Reviewed Labs: ordered.  Risk Prescription drug management.  The patient is urinalysis per my interpretation negative for infection.  Does show hematuria which is consistent with his known kidney stone. Patient is renal CT shows a 3 mm distal stone in the right ureter at the UVJ.  He is renal function is normal.  He has no white  count.  Do not think he has an infected stone.  His urinalysis is pending at this time and will check that.  Patient is pain-free at this time and does not require any analgesia.  Will discharge patient home he will be given referral to urology on-call as well as prescription for pain medication should his pain return.  Patient is agreeable to this        Final Clinical Impression(s) / ED Diagnoses Final diagnoses:  None    Rx / DC Orders ED Discharge Orders     None         Lacretia Leigh, MD 09/04/21 1816    Lacretia Leigh, MD 09/04/21 [phone]

## 2021-09-04 NOTE — ED Notes (Signed)
An After Visit Summary was printed and given to the patient. Discharge instructions given and no further questions at this time.  

## 2021-09-04 NOTE — ED Triage Notes (Signed)
Pt c/o abdominal pain and back pain starting 2 hours ago. Pt c/o 1 episode of emesis. Pt denies nausea at this time. Pt last bowel movement this morning.

## 2023-04-03 IMAGING — DX DG CHEST 1V
1 series · 1 of 1 positions shown · non-contrast
Comparison: None.

CLINICAL DATA: Cough.  [id number] exposure.

EXAM:
CHEST  1 VIEW

[chest pa]
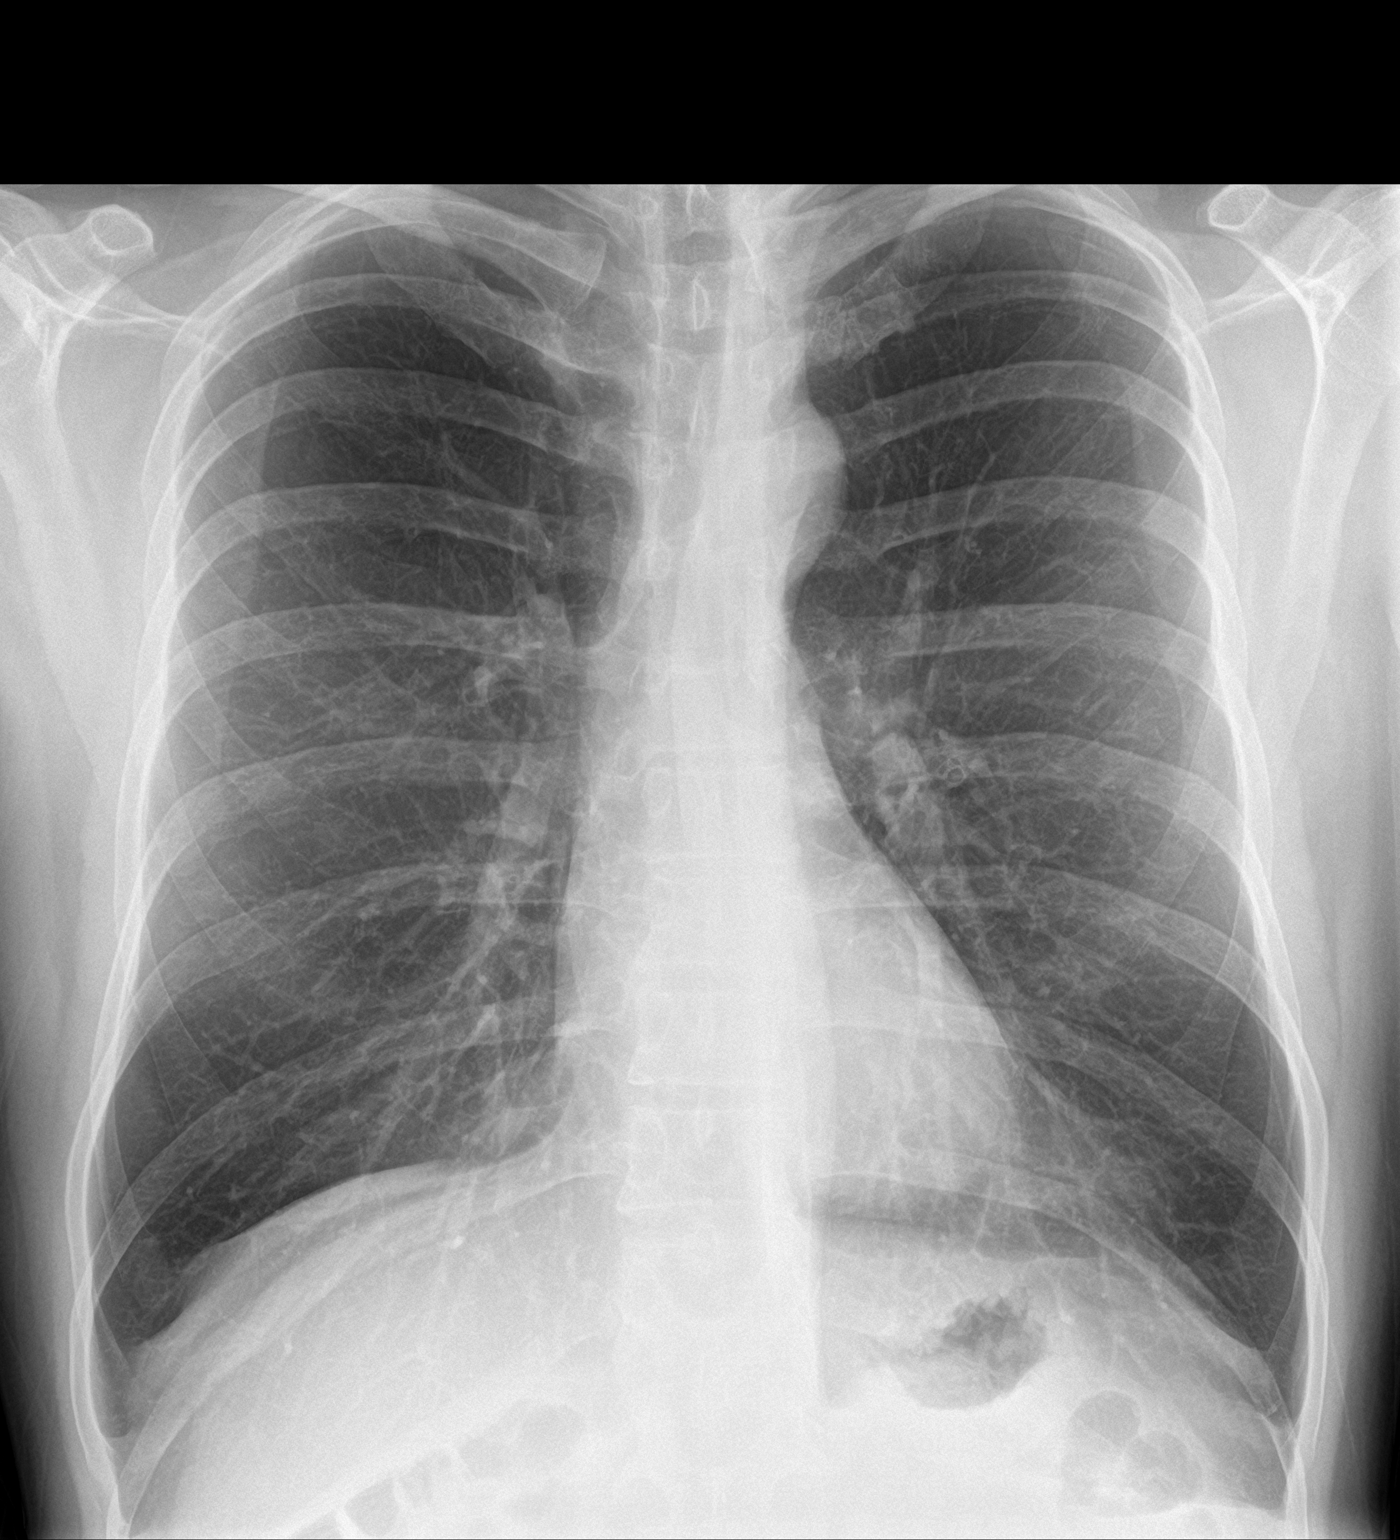

[1 of 1 positions shown; findings below may reference images not displayed]

FINDINGS: The heart, hila, and mediastinum are normal. No pneumothorax.
Hyperinflation of the lungs. No nodule, mass, or focal infiltrate.
IMPRESSION: Hyperinflation of the lungs may be due to an exuberant inspiratory
effort in this relatively young patient. Air trapping from asthma,
COPD, or emphysema could have a similar appearance. Recommend
clinical correlation.

## 2023-07-27 IMAGING — CT CT ANGIO HEAD
1 of 10 series · 6 of 33 positions shown · non-contrast
Comparison: None.

CLINICAL DATA: Initial evaluation for neuro deficit, stroke
suspected.

EXAM:
CT ANGIOGRAPHY HEAD AND NECK
TECHNIQUE: Multidetector CT imaging of the head and neck was performed using
the standard protocol during bolus administration of intravenous
contrast. Multiplanar CT image reconstructions and MIPs were
obtained to evaluate the vascular anatomy. Carotid stenosis
measurements (when applicable) are obtained utilizing NASCET
criteria, using the distal internal carotid diameter as the
denominator.

[Series 11: ax thin · axial · 0.29mm/px · z∈[-312,-62]mm · 6 of 352 slices shown]
[im 51/352  soft-tissue]
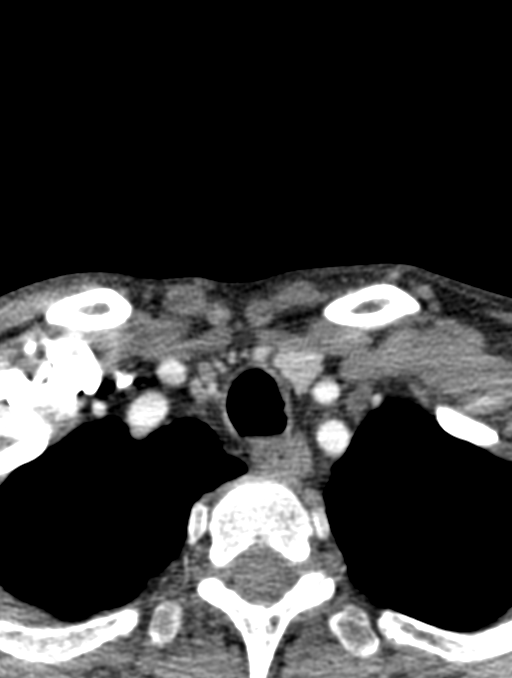
[im 101/352  bone]
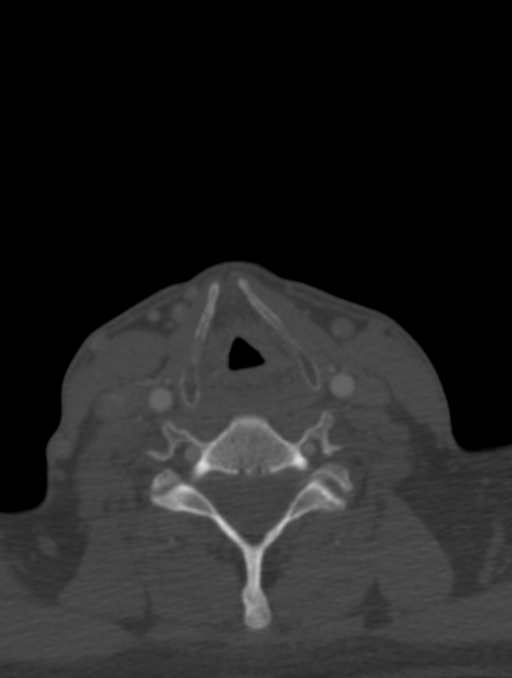
[im 151/352  soft-tissue]
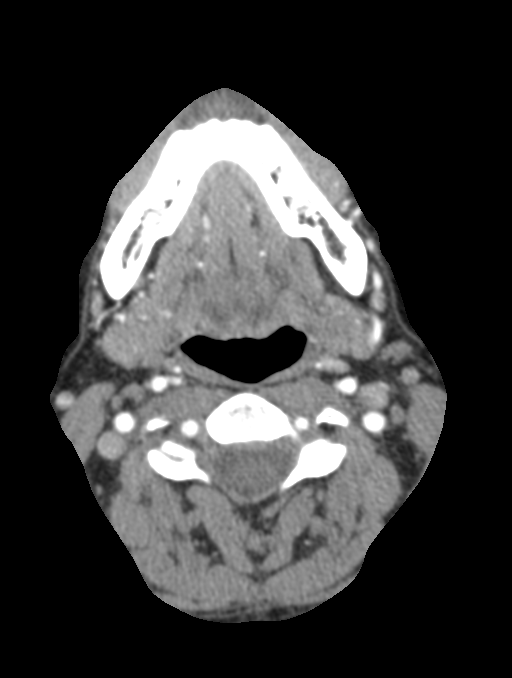
[im 201/352  bone]
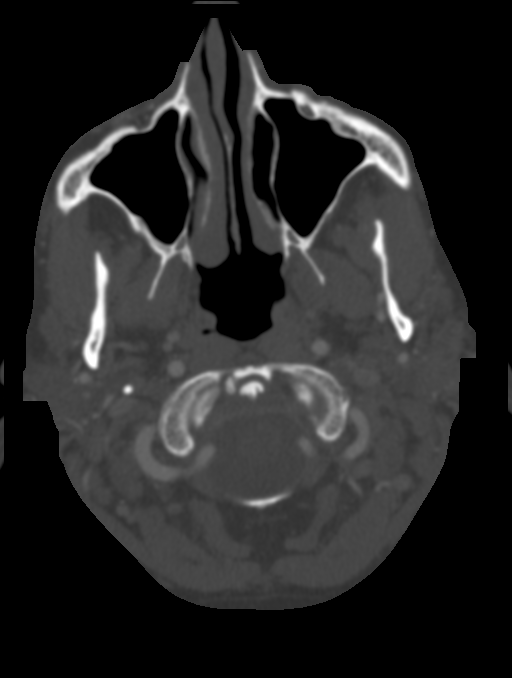
[im 251/352  soft-tissue]
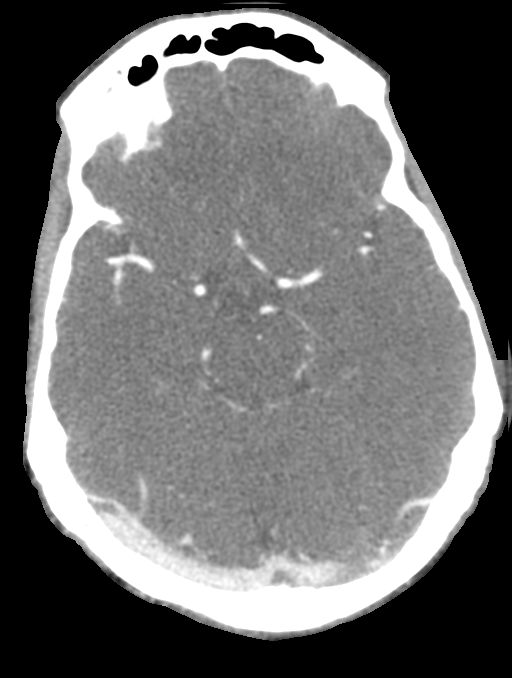
[im 301/352  bone]
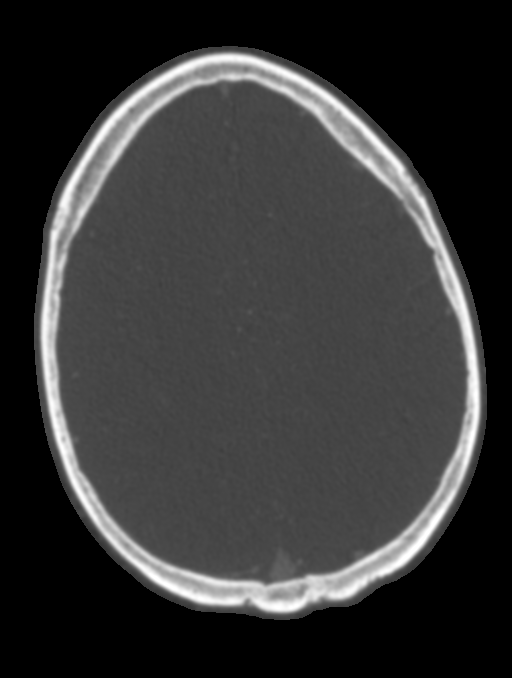

[6 of 33 positions shown; findings below may reference images not displayed]

RADIATION DOSE REDUCTION: This exam was performed according to the
departmental dose-optimization program which includes automated
exposure control, adjustment of the mA and/or kV according to
patient size and/or use of iterative reconstruction technique.

CONTRAST:  75mL OMNIPAQUE IOHEXOL 350 MG/ML SOLN
FINDINGS: CT HEAD FINDINGS

Brain: Cerebral volume within normal limits for patient age.

No evidence for acute intracranial hemorrhage. No findings to
suggest acute large vessel territory infarct. No mass lesion,
midline shift, or mass effect. Ventricles are normal in size without
evidence for hydrocephalus. No extra-axial fluid collection
identified.

Vascular: No hyperdense vessel identified.

Skull: Scalp soft tissues demonstrate no acute abnormality.
Calvarium intact.

Sinuses/Orbits: Globes and orbital soft tissues within normal
limits.

Visualized paranasal sinuses are clear. No mastoid effusion.

CTA NECK FINDINGS

Aortic arch: Visualized aortic arch normal caliber with normal
branch pattern. No stenosis about the origin of the great vessels.

Right carotid system: Right common and internal carotid arteries
widely patent without stenosis, dissection or occlusion.

Left carotid system: Left common and internal carotid arteries
widely patent without stenosis, dissection or occlusion.

Vertebral arteries: Both vertebral arteries arise from the
subclavian arteries. No proximal subclavian artery stenosis. Both
vertebral arteries widely patent without stenosis, dissection or
occlusion.

Skeleton: Unremarkable.

Other neck: No other acute soft tissue abnormality within the neck.

Upper chest: Visualized upper chest demonstrates no acute finding.
Centrilobular emphysema.

Review of the MIP images confirms the above findings

CTA HEAD FINDINGS

Anterior circulation: Both internal carotid arteries widely patent
to the termini without stenosis. A1 segments widely patent. Normal
anterior communicating artery complex. Both anterior cerebral
arteries widely patent to their distal aspects without stenosis. No
M1 stenosis or occlusion. Normal MCA bifurcations. Distal MCA
branches well perfused and symmetric.

Posterior circulation: Both V4 segments patent to the
vertebrobasilar junction without stenosis. Both PICA origins patent
and normal. Basilar widely patent to its distal aspect without
stenosis. Superior cerebellar arteries patent bilaterally. Both PCAs
primarily supplied via the basilar and are well perfused to there
distal aspects.

Venous sinuses: Patent allowing for timing the contrast bolus.

Anatomic variants: None significant.  No aneurysm.

Review of the MIP images confirms the above findings
IMPRESSION: 1. Normal CTA of the head and neck. No large vessel occlusion,
hemodynamically significant stenosis, or other acute vascular
abnormality.
2. Emphysema ([id number]).

## 2023-11-19 IMAGING — DX DG CHEST 1V PORT
2 series · 2 of 2 positions shown · non-contrast
Comparison: Chest radiograph 09/28/2020

CLINICAL DATA: Worsening anxiety.  Chest pain

EXAM:
PORTABLE CHEST 1 VIEW

[chest ap (1 of 2)]
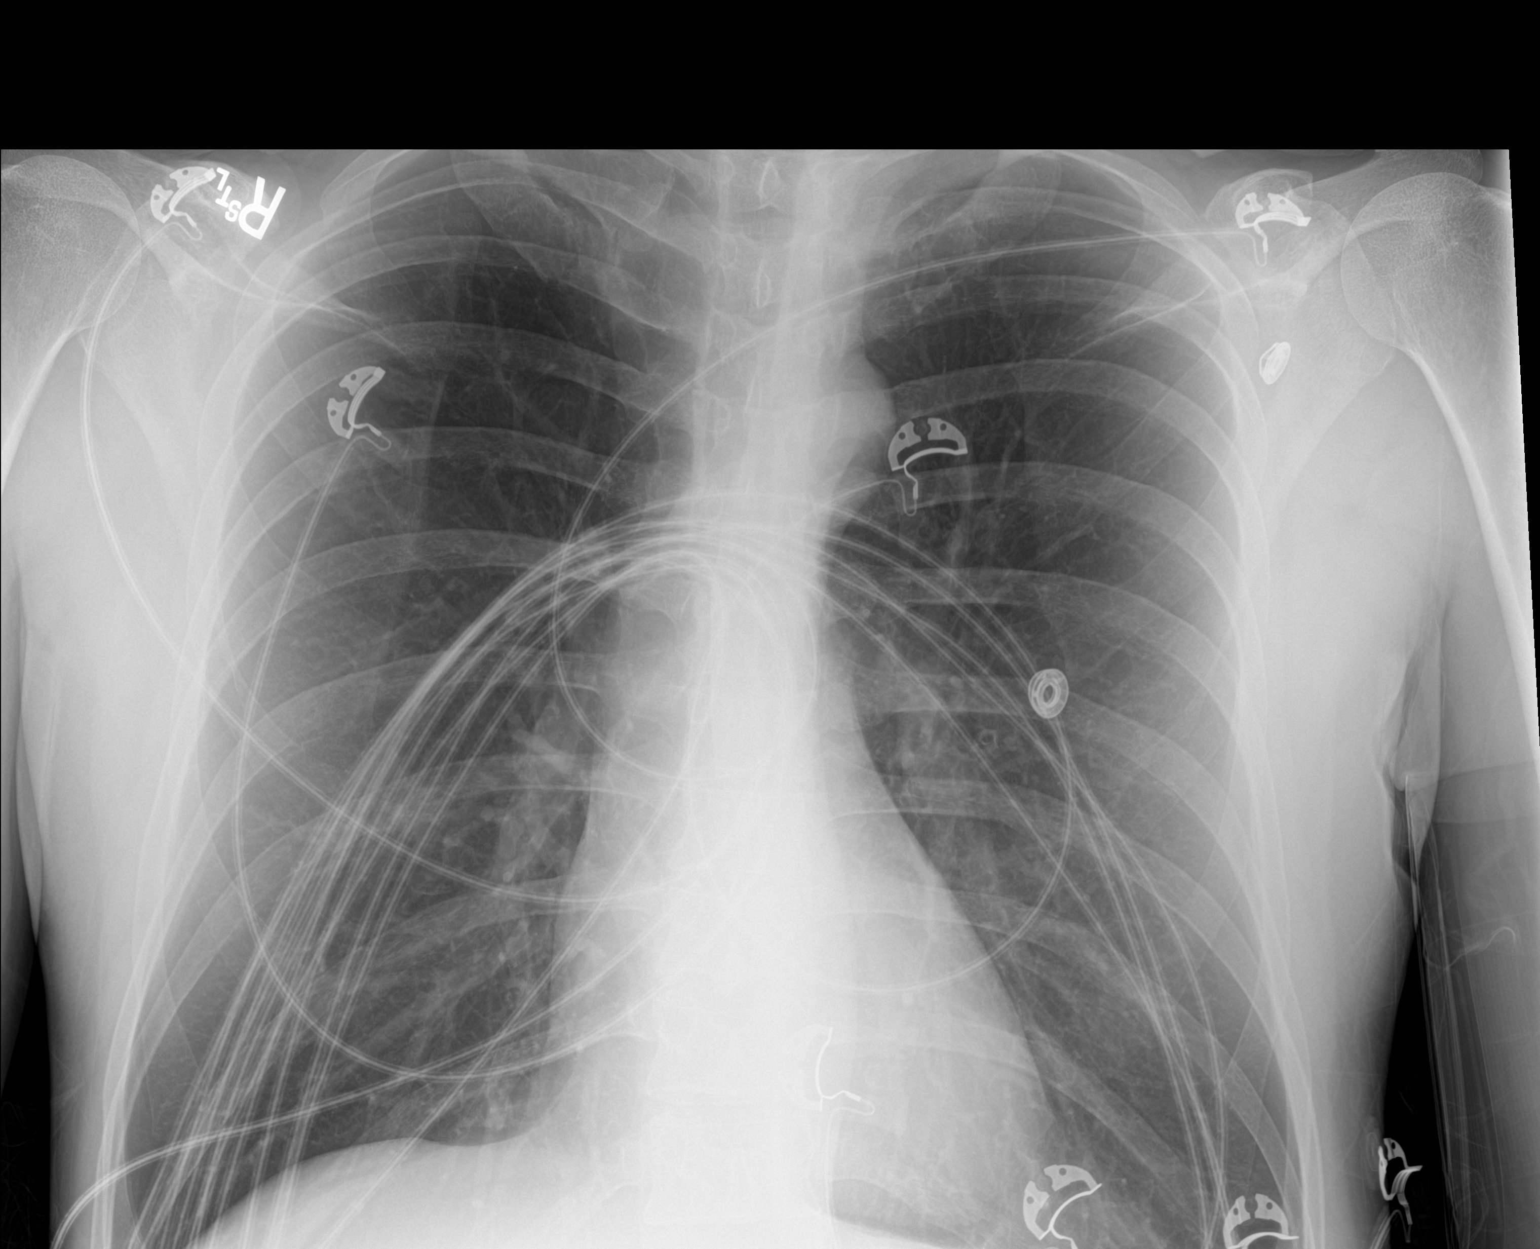

[chest ap (2 of 2)]
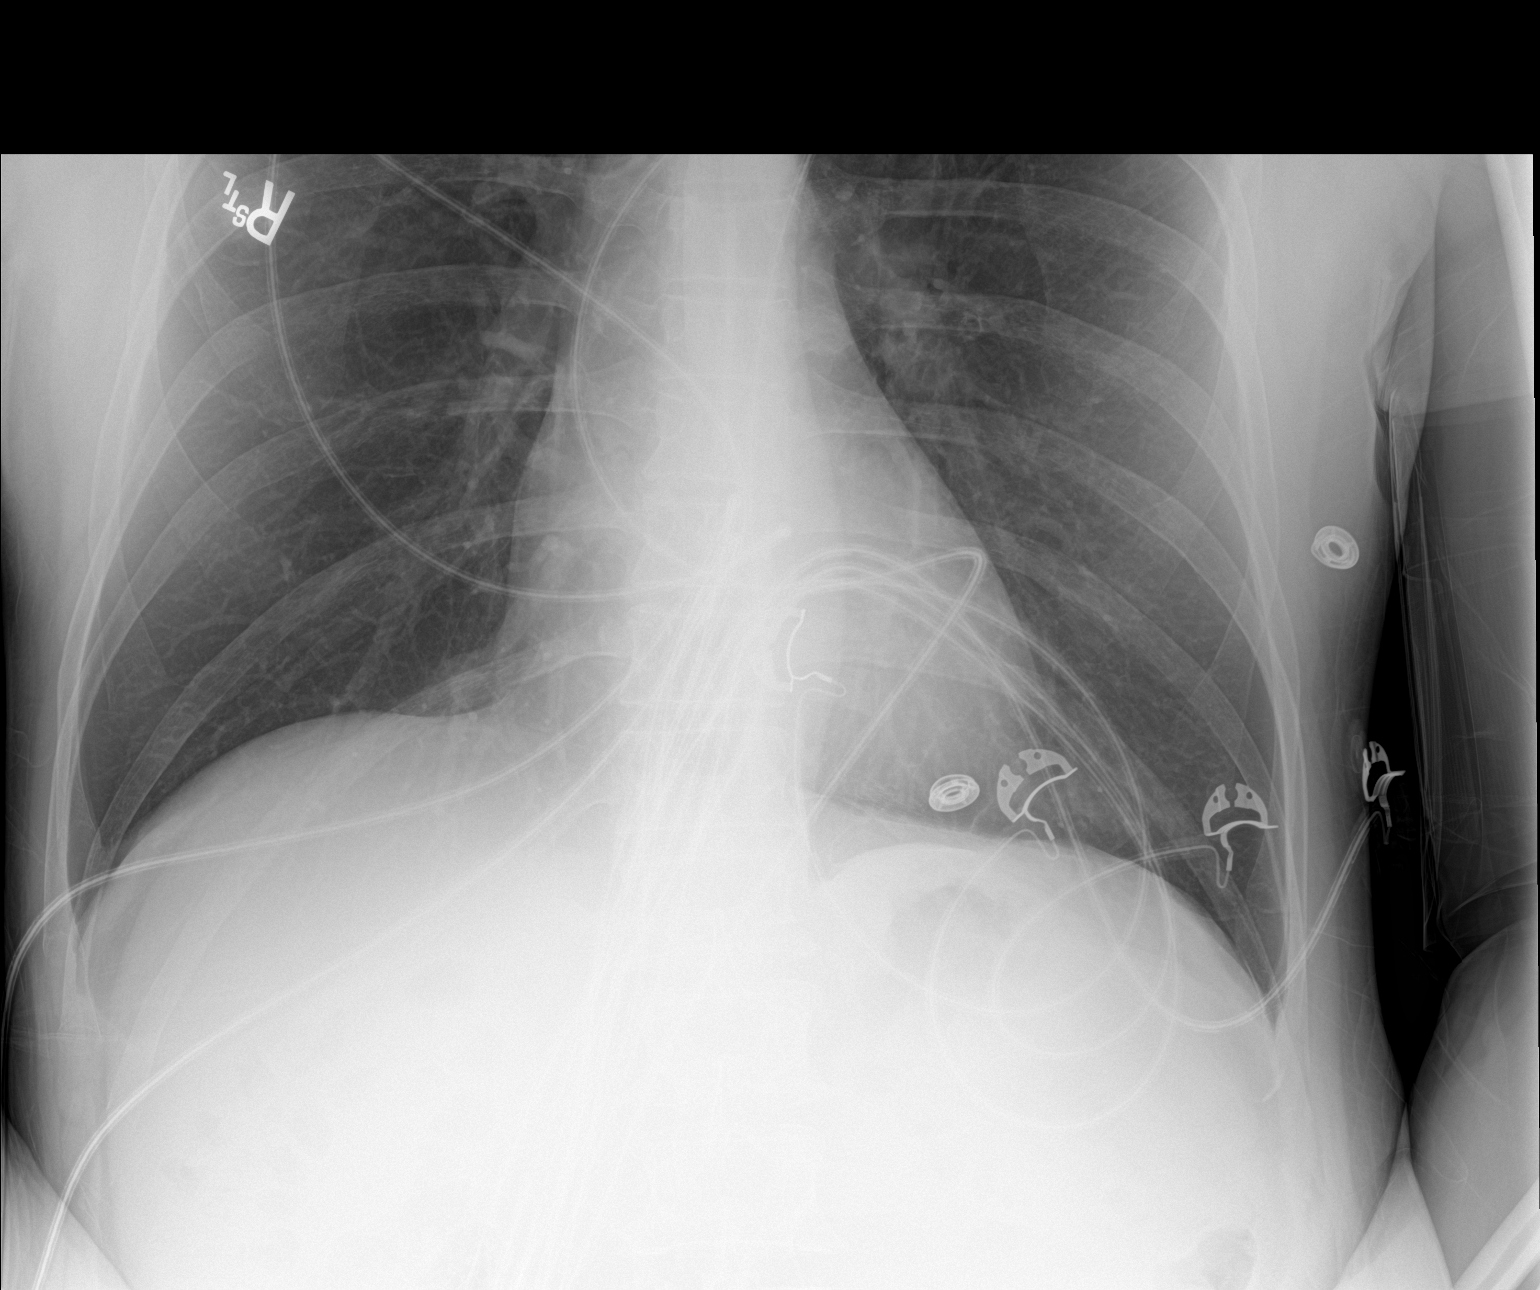

[2 of 2 positions shown; findings below may reference images not displayed]

FINDINGS: Normal cardiomediastinal contours. The lungs are clear. No
pneumothorax or pleural effusion. No acute finding in the regional
skeleton.
IMPRESSION: No acute cardiopulmonary process.
